# Patient Record
Sex: Male | Born: 1935 | Race: Black or African American | Hispanic: No | Marital: Married | Smoking: Never smoker
Health system: Southern US, Community
[De-identification: ages and names within clinical notes are randomized; demographics above are authoritative.]

## PROBLEM LIST (undated history)

## (undated) DIAGNOSIS — C61 Malignant neoplasm of prostate: Secondary | ICD-10-CM

## (undated) DIAGNOSIS — E119 Type 2 diabetes mellitus without complications: Secondary | ICD-10-CM

## (undated) DIAGNOSIS — E785 Hyperlipidemia, unspecified: Secondary | ICD-10-CM

## (undated) DIAGNOSIS — B1710 Acute hepatitis C without hepatic coma: Secondary | ICD-10-CM

## (undated) DIAGNOSIS — I4891 Unspecified atrial fibrillation: Secondary | ICD-10-CM

## (undated) DIAGNOSIS — I10 Essential (primary) hypertension: Secondary | ICD-10-CM

## (undated) DIAGNOSIS — I739 Peripheral vascular disease, unspecified: Secondary | ICD-10-CM

## (undated) HISTORY — DX: Type 2 diabetes mellitus without complications: E11.9

## (undated) HISTORY — DX: Acute hepatitis C without hepatic coma: B17.10

## (undated) HISTORY — DX: Hyperlipidemia, unspecified: E78.5

## (undated) HISTORY — DX: Peripheral vascular disease, unspecified: I73.9

---
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
## 2001-02-27 ENCOUNTER — Emergency Department (HOSPITAL_COMMUNITY): Admission: EM | Admit: 2001-02-27 | Discharge: 2001-02-27 | Payer: Self-pay | Admitting: Emergency Medicine

## 2001-02-27 ENCOUNTER — Encounter: Payer: Self-pay | Admitting: Emergency Medicine

## 2001-07-22 ENCOUNTER — Emergency Department (HOSPITAL_COMMUNITY): Admission: EM | Admit: 2001-07-22 | Discharge: 2001-07-22 | Payer: Self-pay

## 2002-02-28 ENCOUNTER — Encounter: Payer: Self-pay | Admitting: Family Medicine

## 2002-02-28 ENCOUNTER — Ambulatory Visit (HOSPITAL_COMMUNITY): Admission: RE | Admit: 2002-02-28 | Discharge: 2002-02-28 | Payer: Self-pay | Admitting: Family Medicine

## 2002-09-18 ENCOUNTER — Encounter (INDEPENDENT_AMBULATORY_CARE_PROVIDER_SITE_OTHER): Payer: Self-pay

## 2002-09-18 ENCOUNTER — Encounter: Payer: Self-pay | Admitting: Gastroenterology

## 2002-09-18 ENCOUNTER — Ambulatory Visit (HOSPITAL_COMMUNITY): Admission: RE | Admit: 2002-09-18 | Discharge: 2002-09-18 | Payer: Self-pay | Admitting: Gastroenterology

## 2003-08-20 ENCOUNTER — Ambulatory Visit (HOSPITAL_COMMUNITY): Admission: RE | Admit: 2003-08-20 | Discharge: 2003-08-20 | Payer: Self-pay | Admitting: Internal Medicine

## 2004-04-12 ENCOUNTER — Ambulatory Visit: Payer: Self-pay | Admitting: Internal Medicine

## 2004-06-28 ENCOUNTER — Ambulatory Visit: Payer: Self-pay | Admitting: Internal Medicine

## 2005-02-15 ENCOUNTER — Ambulatory Visit: Payer: Self-pay | Admitting: Internal Medicine

## 2005-02-18 ENCOUNTER — Ambulatory Visit: Payer: Self-pay | Admitting: Internal Medicine

## 2005-02-25 ENCOUNTER — Ambulatory Visit: Payer: Self-pay | Admitting: Internal Medicine

## 2005-03-04 ENCOUNTER — Ambulatory Visit: Payer: Self-pay | Admitting: Internal Medicine

## 2005-08-25 ENCOUNTER — Ambulatory Visit: Payer: Self-pay | Admitting: Internal Medicine

## 2006-09-27 ENCOUNTER — Ambulatory Visit: Payer: Self-pay | Admitting: Internal Medicine

## 2006-09-27 LAB — CONVERTED CEMR LAB
ALT: 17 units/L (ref 0–53)
AST: 26 units/L (ref 0–37)
Albumin: 4 g/dL (ref 3.5–5.2)
Alkaline Phosphatase: 78 units/L (ref 39–117)
BUN: 16 mg/dL (ref 6–23)
Basophils Absolute: 0 10*3/uL (ref 0.0–0.1)
Basophils Relative: 0.1 % (ref 0.0–1.0)
Bilirubin, Direct: 0.1 mg/dL (ref 0.0–0.3)
CO2: 32 meq/L (ref 19–32)
Calcium: 9.4 mg/dL (ref 8.4–10.5)
Chloride: 104 meq/L (ref 96–112)
Cholesterol: 195 mg/dL (ref 0–200)
Creatinine, Ser: 1.1 mg/dL (ref 0.4–1.5)
Eosinophils Absolute: 0.1 10*3/uL (ref 0.0–0.6)
Eosinophils Relative: 1.4 % (ref 0.0–5.0)
GFR calc Af Amer: 85 mL/min
GFR calc non Af Amer: 70 mL/min
Glucose, Bld: 88 mg/dL (ref 70–99)
HCT: 44.8 % (ref 39.0–52.0)
HDL: 29.9 mg/dL — ABNORMAL LOW (ref >39.0)
Hemoglobin: 15.4 g/dL (ref 13.0–17.0)
LDL Cholesterol: 133 mg/dL — ABNORMAL HIGH (ref 0–99)
Lymphocytes Relative: 45.3 % (ref 12.0–46.0)
MCHC: 34.3 g/dL (ref 30.0–36.0)
MCV: 92.5 fL (ref 78.0–100.0)
Monocytes Absolute: 0.7 10*3/uL (ref 0.2–0.7)
Monocytes Relative: 11.1 % — ABNORMAL HIGH (ref 3.0–11.0)
Neutro Abs: 2.5 10*3/uL (ref 1.4–7.7)
Neutrophils Relative %: 42.1 % — ABNORMAL LOW (ref 43.0–77.0)
PSA: 5.46 ng/mL — ABNORMAL HIGH (ref 0.10–4.00)
Platelets: 209 10*3/uL (ref 150–400)
Potassium: 4.4 meq/L (ref 3.5–5.1)
RBC: 4.85 M/uL (ref 4.22–5.81)
RDW: 12 % (ref 11.5–14.6)
Sodium: 141 meq/L (ref 135–145)
TSH: 1.75 microintl units/mL (ref 0.35–5.50)
Total Bilirubin: 1.2 mg/dL (ref 0.3–1.2)
Total CHOL/HDL Ratio: 6.5
Total Protein: 7.7 g/dL (ref 6.0–8.3)
Triglycerides: 161 mg/dL — ABNORMAL HIGH (ref 0–149)
VLDL: 32 mg/dL (ref 0–40)
WBC: 6 10*3/uL (ref 4.5–10.5)

## 2006-10-27 ENCOUNTER — Ambulatory Visit: Payer: Self-pay | Admitting: Internal Medicine

## 2006-11-07 ENCOUNTER — Encounter: Payer: Self-pay | Admitting: Internal Medicine

## 2006-11-07 ENCOUNTER — Ambulatory Visit: Payer: Self-pay | Admitting: Internal Medicine

## 2007-01-02 ENCOUNTER — Encounter: Payer: Self-pay | Admitting: Internal Medicine

## 2007-01-02 DIAGNOSIS — B1710 Acute hepatitis C without hepatic coma: Secondary | ICD-10-CM | POA: Insufficient documentation

## 2007-01-02 DIAGNOSIS — M109 Gout, unspecified: Secondary | ICD-10-CM

## 2007-01-02 DIAGNOSIS — B351 Tinea unguium: Secondary | ICD-10-CM

## 2007-01-02 DIAGNOSIS — I10 Essential (primary) hypertension: Secondary | ICD-10-CM | POA: Insufficient documentation

## 2007-02-22 HISTORY — PX: EYE SURGERY: SHX253

## 2007-08-01 ENCOUNTER — Encounter: Payer: Self-pay | Admitting: Internal Medicine

## 2007-08-02 ENCOUNTER — Emergency Department (HOSPITAL_COMMUNITY): Admission: EM | Admit: 2007-08-02 | Discharge: 2007-08-03 | Payer: Self-pay | Admitting: Emergency Medicine

## 2007-08-02 ENCOUNTER — Telehealth: Payer: Self-pay | Admitting: Internal Medicine

## 2007-08-08 ENCOUNTER — Ambulatory Visit: Payer: Self-pay | Admitting: Internal Medicine

## 2007-08-08 DIAGNOSIS — I4949 Other premature depolarization: Secondary | ICD-10-CM

## 2007-08-08 DIAGNOSIS — I739 Peripheral vascular disease, unspecified: Secondary | ICD-10-CM

## 2007-08-22 ENCOUNTER — Encounter: Payer: Self-pay | Admitting: Internal Medicine

## 2007-08-22 ENCOUNTER — Ambulatory Visit: Payer: Self-pay

## 2007-09-01 ENCOUNTER — Encounter: Payer: Self-pay | Admitting: Internal Medicine

## 2008-03-06 ENCOUNTER — Ambulatory Visit: Payer: Self-pay | Admitting: Internal Medicine

## 2008-05-02 ENCOUNTER — Ambulatory Visit: Payer: Self-pay | Admitting: Internal Medicine

## 2008-05-02 ENCOUNTER — Telehealth (INDEPENDENT_AMBULATORY_CARE_PROVIDER_SITE_OTHER): Payer: Self-pay | Admitting: *Deleted

## 2008-05-02 DIAGNOSIS — R7309 Other abnormal glucose: Secondary | ICD-10-CM

## 2008-05-02 LAB — CONVERTED CEMR LAB
ALT: 17 units/L (ref 0–53)
AST: 25 units/L (ref 0–37)
Albumin: 3.9 g/dL (ref 3.5–5.2)
Alkaline Phosphatase: 101 units/L (ref 39–117)
BUN: 15 mg/dL (ref 6–23)
Basophils Absolute: 0 10*3/uL (ref 0.0–0.1)
Basophils Relative: 0.8 % (ref 0.0–3.0)
Bilirubin, Direct: 0.1 mg/dL (ref 0.0–0.3)
CO2: 33 meq/L — ABNORMAL HIGH (ref 19–32)
Calcium: 9.2 mg/dL (ref 8.4–10.5)
Chloride: 98 meq/L (ref 96–112)
Creatinine, Ser: 1.2 mg/dL (ref 0.4–1.5)
Crystals: NEGATIVE
Eosinophils Absolute: 0.1 10*3/uL (ref 0.0–0.7)
Eosinophils Relative: 1.5 % (ref 0.0–5.0)
GFR calc Af Amer: 77 mL/min
GFR calc non Af Amer: 63 mL/min
Glucose, Bld: 307 mg/dL — ABNORMAL HIGH (ref 70–99)
HCT: 45.9 % (ref 39.0–52.0)
Hemoglobin, Urine: NEGATIVE
Hemoglobin: 16 g/dL (ref 13.0–17.0)
Leukocytes, UA: NEGATIVE
Lymphocytes Relative: 48 % — ABNORMAL HIGH (ref 12.0–46.0)
MCHC: 34.8 g/dL (ref 30.0–36.0)
MCV: 93.1 fL (ref 78.0–100.0)
Monocytes Absolute: 0.5 10*3/uL (ref 0.1–1.0)
Monocytes Relative: 9.3 % (ref 3.0–12.0)
Neutro Abs: 2.1 10*3/uL (ref 1.4–7.7)
Neutrophils Relative %: 40.4 % — ABNORMAL LOW (ref 43.0–77.0)
Nitrite: NEGATIVE
Platelets: 195 10*3/uL (ref 150–400)
Potassium: 3.7 meq/L (ref 3.5–5.1)
RBC: 4.94 M/uL (ref 4.22–5.81)
RDW: 11.9 % (ref 11.5–14.6)
Sodium: 137 meq/L (ref 135–145)
Specific Gravity, Urine: >=1.03 (ref 1.000–1.035)
Total Bilirubin: 1 mg/dL (ref 0.3–1.2)
Total Protein, Urine: 100 mg/dL — AB
Total Protein: 7.8 g/dL (ref 6.0–8.3)
Uric Acid, Serum: 5.7 mg/dL (ref 4.0–7.8)
Urine Glucose: 250 mg/dL — AB
Urobilinogen, UA: 1 (ref 0.0–1.0)
WBC: 5.1 10*3/uL (ref 4.5–10.5)
pH: 6 (ref 5.0–8.0)

## 2008-05-06 ENCOUNTER — Ambulatory Visit: Payer: Self-pay | Admitting: Internal Medicine

## 2008-05-06 DIAGNOSIS — E119 Type 2 diabetes mellitus without complications: Secondary | ICD-10-CM

## 2008-05-07 ENCOUNTER — Ambulatory Visit: Payer: Self-pay | Admitting: Internal Medicine

## 2008-05-07 LAB — CONVERTED CEMR LAB: Hgb A1c MFr Bld: 7.2 % — ABNORMAL HIGH (ref 4.6–6.5)

## 2008-08-07 ENCOUNTER — Ambulatory Visit: Payer: Self-pay | Admitting: Internal Medicine

## 2008-08-07 LAB — CONVERTED CEMR LAB: Hgb A1c MFr Bld: 5.6 % (ref 4.6–6.5)

## 2008-08-10 ENCOUNTER — Telehealth: Payer: Self-pay | Admitting: Internal Medicine

## 2009-10-02 ENCOUNTER — Ambulatory Visit: Payer: Self-pay | Admitting: Internal Medicine

## 2009-10-02 DIAGNOSIS — N529 Male erectile dysfunction, unspecified: Secondary | ICD-10-CM | POA: Insufficient documentation

## 2009-10-02 LAB — CONVERTED CEMR LAB
Albumin: 4.3 g/dL (ref 3.5–5.2)
BUN: 16 mg/dL (ref 6–23)
Chloride: 102 meq/L (ref 96–112)
Creatinine, Ser: 1 mg/dL (ref 0.4–1.5)
GFR calc non Af Amer: 98.55 mL/min (ref >60)
Potassium: 3.9 meq/L (ref 3.5–5.1)
Testosterone: 227.56 ng/dL — ABNORMAL LOW (ref 350.00–890.00)
Total Bilirubin: 1 mg/dL (ref 0.3–1.2)
Uric Acid, Serum: 7.5 mg/dL (ref 4.0–7.8)

## 2009-10-09 ENCOUNTER — Ambulatory Visit: Payer: Self-pay | Admitting: Internal Medicine

## 2010-01-18 ENCOUNTER — Telehealth: Payer: Self-pay | Admitting: Internal Medicine

## 2010-03-23 NOTE — Assessment & Plan Note (Signed)
Summary: PER MD SUCTURE REMOVAL --STC   Vital Signs:  Patient profile:   77 year old male Height:      67 inches Weight:      189 pounds BMI:     29.71 O2 Sat:      98 % on Room air Temp:     97.6 degrees F oral Pulse rate:   60 / minute BP sitting:   112 / 78  (left arm) Cuff size:   regular  Vitals Entered By: Bill Salinas CMA (October 09, 2009 11:21 AM)  O2 Flow:  Room air CC: pt here for suture removal/ ab Comments Pt states he is not taking tylenol, oxybutynin or indomethacin   Primary Care Provider:  Norins  CC:  pt here for suture removal/ ab.  History of Present Illness: patient for suture removal after excision of lipoma right proximal UE. No complications reported  Current Medications (verified): 1)  Amlodipine Besylate 10 Mg  Tabs (Amlodipine Besylate) .Marland Kitchen.. 1 By Mouth Once Daily For Blood Pressure 2)  Tylenol Extra Strength 500 Mg  Tabs (Acetaminophen) .... As Needed 3)  Allopurinol 300 Mg Tabs (Allopurinol) .... Take One Tab By Mouth After Meals For Gout. (See Comments) 4)  Oxybutynin Chloride 5 Mg Tabs (Oxybutynin Chloride) .... Take 1 Tablet By Mouth Two Times A Day 5)  Indomethacin 25 Mg Caps (Indomethacin) .Marland Kitchen.. 1 By Mouth Three Times A Day As Needed Acute Gout Flare. 6)  Colcrys 0.6 Mg Tabs (Colchicine) .... One By Mouth Bid  Allergies (verified): 1)  ! * Hair Dye PMH-FH-SH reviewed-no changes except otherwise noted  Physical Exam  General:  Well-developed,well-nourished,in no acute distress; alert,appropriate and cooperative throughout examination Skin:  wound on proximal RUE with good healing, no sign of inflammation. Sutures removed without problem.   Impression & Recommendations:  Problem # 1:  LIPOMA, SKIN (ICD-214.1) for wound check and suture removal: no complications. There will be a residual nodule at site.   Complete Medication List: 1)  Amlodipine Besylate 10 Mg Tabs (Amlodipine besylate) .Marland Kitchen.. 1 by mouth once daily for blood pressure 2)   Tylenol Extra Strength 500 Mg Tabs (Acetaminophen) .... As needed 3)  Allopurinol 300 Mg Tabs (Allopurinol) .... Take one tab by mouth after meals for gout. (see comments) 4)  Oxybutynin Chloride 5 Mg Tabs (Oxybutynin chloride) .... Take 1 tablet by mouth two times a day 5)  Indomethacin 25 Mg Caps (Indomethacin) .Marland Kitchen.. 1 by mouth three times a day as needed acute gout flare. 6)  Colcrys 0.6 Mg Tabs (Colchicine) .... One by mouth bid

## 2010-03-23 NOTE — Miscellaneous (Signed)
Summary: [facility]   Imported By: Sherian Rein 10/06/2009 11:08:06  _____________________________________________________________________  External Attachment:    Type:   Image     Comment:   External Document

## 2010-03-23 NOTE — Progress Notes (Signed)
  Phone Note Refill Request Message from:  Patient on January 18, 2010 3:50 PM  Refills Requested: Medication #1:  AMLODIPINE BESYLATE 10 MG  TABS 1 by mouth once daily for blood pressure Initial call taken by: Ami Bullins CMA,  January 18, 2010 3:50 PM    Prescriptions: AMLODIPINE BESYLATE 10 MG  TABS (AMLODIPINE BESYLATE) 1 by mouth once daily for blood pressure  #60 x 2   Entered by:   Ami Bullins CMA   Authorized by:   Jacques Navy MD   Signed by:   Bill Salinas CMA on 01/18/2010   Method used:   Electronically to        [id number] (retail)       [address] [place], [place]  [address]       Ph: [phone]       Fax: [phone]   RxID:   [id number]

## 2010-03-23 NOTE — Assessment & Plan Note (Signed)
Summary: KNOT UNDER ARM FOR A YR/ REFER TO SURGERY? /NWS  #   Vital Signs:  Patient profile:   77 year old male Height:      67 inches (170.18 cm) Weight:      186 pounds (84.55 kg) BMI:     29.24 O2 Sat:      98 % on Room air Temp:     97.1 degrees F (36.17 degrees C) oral Pulse rate:   73 / minute BP sitting:   150 / 90  (left arm) Cuff size:   regular  Vitals Entered By: Lucious Groves CMA (October 02, 2009 10:37 AM)  O2 Flow:  Room air CC: C/O knot on arm for over a year that he would like removed./kb Is Patient Diabetic? Yes Did you bring your meter with you today? No Pain Assessment Patient in pain? no      Comments Patient notes that the knot is not painful, and the only med he is taking is Amlodipine./kb   Primary Care Provider:  Norins  CC:  C/O knot on arm for over a year that he would like removed./kb.  History of Present Illness: Patient presents with a small soft tumor on the right UE at bse of deltoid. He finds this uncomfortable and is concerned about the nature of the lesion. He is requesting excision.   He c/o decreased erectile function.  Current Medications (verified): 1)  Amlodipine Besylate 10 Mg  Tabs (Amlodipine Besylate) .Marland Kitchen.. 1 By Mouth Once Daily For Blood Pressure 2)  Tylenol Extra Strength 500 Mg  Tabs (Acetaminophen) .... As Needed 3)  Allopurinol 300 Mg Tabs (Allopurinol) .... Take One Tab By Mouth After Meals For Gout. (See Comments) 4)  Oxybutynin Chloride 5 Mg Tabs (Oxybutynin Chloride) .... Take 1 Tablet By Mouth Two Times A Day 5)  Indomethacin 25 Mg Caps (Indomethacin) .Marland Kitchen.. 1 By Mouth Three Times A Day As Needed Acute Gout Flare. 6)  Colcrys 0.6 Mg Tabs (Colchicine) .... One By Mouth Bid  Allergies (verified): 1)  ! * Hair Dye  Past History:  Past Medical History: Last updated: 08/08/2007  ONYCHOMYCOSIS (ICD-110.1) GOUT (ICD-274.9) HYPERTENSION (ICD-401.9) HEPATITIS C (ICD-070.51)  Past Surgical History: Last updated:  08/08/2007 cataracts '09 PSH reviewed for relevance, FH reviewed for relevance  Review of Systems  The patient denies anorexia, fever, weight loss, dyspnea on exertion, peripheral edema, abdominal pain, severe indigestion/heartburn, and difficulty walking.    Physical Exam  General:  WNWD african gentleman in no distress Skin:  1.5 cm soft tissue lump[ proximal right UE.   Impression & Recommendations:  Problem # 1:  LIPOMA, SKIN (ICD-214.1)  Patient with suspicious soft tissue lesion right proximal UE  Procedure - surgical excision of tumor 1.5 cm diameter Consent- signed informed consent Anesthesia - 2% xylocain with epinephrine Prep - betadiene Procedure - incised the lesion with # 15 scalpel, explored with curved hemostate and single tooth forceps. Small fatty tumor excised carefully with exposure to the fascia of the triceps. No bleeding encountered. Closed the wound with 3 interrupted sutures using 3-0 ethilon. Bandaide applied. Patient tolerated well. Routine wound care instructions provided. Return in 8 days for suture removal  Orders: Excise lesion (TAL) 1.1 - 2.0 cm (11402)  Complete Medication List: 1)  Amlodipine Besylate 10 Mg Tabs (Amlodipine besylate) .Marland Kitchen.. 1 by mouth once daily for blood pressure 2)  Tylenol Extra Strength 500 Mg Tabs (Acetaminophen) .... As needed 3)  Allopurinol 300 Mg Tabs (Allopurinol) .... Take one  tab by mouth after meals for gout. (see comments) 4)  Oxybutynin Chloride 5 Mg Tabs (Oxybutynin chloride) .... Take 1 tablet by mouth two times a day 5)  Indomethacin 25 Mg Caps (Indomethacin) .Marland Kitchen.. 1 by mouth three times a day as needed acute gout flare. 6)  Colcrys 0.6 Mg Tabs (Colchicine) .... One by mouth bid  Other Orders: TLB-Testosterone, Total (84403-TESTO) TLB-BMP (Basic Metabolic Panel-BMET) (80048-METABOL) TLB-Uric Acid, Blood (84550-URIC) TLB-Hepatic/Liver Function Pnl (80076-HEPATIC)

## 2010-07-06 NOTE — Assessment & Plan Note (Signed)
[facility]                           PRIMARY CARE OFFICE NOTE   Dakota, Scott                    MRN:          [id number]  DATE:09/27/2006                            DOB:          Aug 25, 1933    ADDENDUM:  Laboratory ordered and pending includes a lipid panel, PSA,  comprehensive metabolic panel.   ASSESSMENT AND PLAN:  1. Hypertension.  The patient is adequately controlled on his present      medications, he will continue the same.  2. Gout.  No flares of gout are noted.  The patient will continue on      allopurinol.  3. Irritable bladder.  Patient is tolerating his oxybutynin without      difficulty or significant dryness.  4. Health maintenance.  Patient with a normal EKG at today's visit.  5. Laboratories were ordered and pending and patient will be notified      via phone tree.  6. Patient is scheduled for colonoscopy for September 16th at 10 a.m.      with Dr. Leone Payor.  He is also scheduled for preop on October 27, 2006, and the patient is aware of these appointments.  7. The patient will be notified by phone tree as to his laboratory      results when available.     Rosalyn Gess Norins, MD  Electronically Signed    MEN/MedQ  DD: 09/27/2006  DT: 09/28/2006  Job #: [id number]   cc:   Alfonzo Feller

## 2010-07-06 NOTE — Assessment & Plan Note (Signed)
[facility]                           PRIMARY CARE OFFICE NOTE   Dakota Scott, Dakota Scott                    MRN:          [id number]  DATE:09/27/2006                            DOB:          June 19, 1933    Dakota Scott is a 77 year old gentleman from [place], who  presents for a followup evaluation and exam.  He was last seen in the  office August 25, 2005, in followup for excision of a skin nodule in his  right shoulder.  He also was checked for hypertension at that time.   INTERVAL HISTORY:  The patient reports he has been feeling well and  doing well, with only minor complaints of occasional shoulder discomfort  and some mild back pain.   PAST MEDICAL HISTORY:  Surgical:  None.   Medical:  1. Usual childhood disease.  2. Hospitalized for fevers, 1953.  3. Blood in his stool as a youth.  4. Arthritis.  5. Hypertension.  6. History of Hepatitis C treated with Pegasys and ribavirin,      completing therapy May 15, 2003, with no detectible virus.  7. Irritable bladder.   CURRENT MEDICATIONS:  1. Norvasc 10 mg daily.  2. Allopurinol 300 mg daily.  3. Oxybutynin 5 mg b.i.d.   ALLERGIES:  None.  THE PATIENT IS ALLERGIC TO HAIR DYE.   FAMILY HISTORY:  Negative for colon cancer, lung cancer, hyperlipidemia,  heart disease or diabetes.   SOCIAL HISTORY:  The patient is a native of [place].  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  Patient moved to the [place] in 1998  with his wife, son and daughter.  He has 4 other daughters, one in  [place], two in [place], one in [place].  The patient works for  [employer].   REVIEW OF SYSTEMS:  Patient has had no fevers, sweats, chills.  His  weight has been stable.  Patient reports he does have some blurred  vision and difficulty with vision and bright light.  No ENT complaints,  no cardiovascular,  respiratory, GI or GU complaints.  Musculoskeletal  complaints include some shoulder pain and low back pain.   EXAMINATION:  Temperature 97.8, blood pressure 132/81, pulse was 71,  weight 194.  GENERAL APPEARANCE:  A well-nourished, well-developed African gentleman  looking younger than his stated chronologic age of 42.  He is in no  acute distress.  HEENT EXAM:  Normocephalic, atraumatic.  EACs and TMs reveal cerumen on  the right, left TM was visualized and normal.  Oropharynx with native  dentition with no obvious caries, no gingivitis, no lesions on the  buccal membranes.  Posterior pharynx was clear.  Conjunctiva and sclera  was clear.  Pupils equal, round, reactive to light and accommodation.  Funduscopic exam on the right hindered by presence of a cataract.  Left  was difficult to bring to focus but no obvious abnormalities were noted.  NECK:  Supple without thyromegaly.  No adenopathy was noted in the  cervical, supraclavicular,  axillary or inguinal regions.  CHEST:  No deformities are noted.  LUNGS:  Clear with no rales, wheezes or rhonchi.  CARDIOVASCULAR:  Radial pulse 2+ and 2+ dorsalis pedis pulse.  He had no  JVD, no carotid bruits.  He had a quiet precordium.  He had a regular  rate and rhythm without murmurs, rubs or gallops.  He was noted to have  a bruit at the right femoral artery.  An easily palpable pulse is noted.  ABDOMEN:  Soft, no guarding or rebound, there is no organosplenomegaly  noted.  RECTAL EXAM:  Normal sphincter tone was noted.  The prostate was smooth,  round and normal in size and contour without nodules.  No tenderness was  noted.  EXTREMITIES:  Without deformity.  He has full range of motion of his  right shoulder but he does have some mild tenderness.  SKIN:  Patient has a small scar at his right shoulder.  No other obvious  skin lesions were noted.   DATABASE:  A 12-lead electrocardiogram revealed a normal sinus rhythm  with a premature  ventricular complex or effusion complex noted.     Rosalyn Gess Norins, MD  Electronically Signed    MEN/MedQ  DD: 09/27/2006  DT: 09/28/2006  Job #: [id number]

## 2010-07-27 ENCOUNTER — Other Ambulatory Visit: Payer: Self-pay | Admitting: Internal Medicine

## 2010-08-12 ENCOUNTER — Other Ambulatory Visit: Payer: Self-pay | Admitting: *Deleted

## 2010-08-12 MED ORDER — COLCHICINE 0.6 MG PO TABS: 0.6000 mg | ORAL_TABLET | Freq: Every day | ORAL | Status: DC

## 2010-08-12 MED ORDER — ALLOPURINOL 300 MG PO TABS: 300.0000 mg | ORAL_TABLET | Freq: Every day | ORAL | Status: DC

## 2010-08-23 ENCOUNTER — Other Ambulatory Visit: Payer: Self-pay | Admitting: Internal Medicine

## 2010-11-18 LAB — COMPREHENSIVE METABOLIC PANEL
Albumin: 3.8
Alkaline Phosphatase: 90
BUN: 13
Chloride: 101
Glucose, Bld: 141 — ABNORMAL HIGH
Potassium: 3.1 — ABNORMAL LOW
Total Bilirubin: 1.3 — ABNORMAL HIGH

## 2010-11-18 LAB — URINALYSIS, ROUTINE W REFLEX MICROSCOPIC
Bilirubin Urine: NEGATIVE
Glucose, UA: NEGATIVE
Hgb urine dipstick: NEGATIVE
Specific Gravity, Urine: 1.024
Urobilinogen, UA: 1
pH: 6.5

## 2010-11-18 LAB — CBC
HCT: 44.5
Hemoglobin: 15.4
Platelets: 199
WBC: 15.1 — ABNORMAL HIGH

## 2010-11-18 LAB — DIFFERENTIAL
Basophils Absolute: 0
Basophils Relative: 0
Eosinophils Absolute: 0
Monocytes Absolute: 0.9
Neutro Abs: 12.8 — ABNORMAL HIGH

## 2010-11-18 LAB — URINE CULTURE
Colony Count: NO GROWTH
Culture: NO GROWTH

## 2010-11-18 LAB — POCT CARDIAC MARKERS: CKMB, poc: 1.6

## 2010-11-22 ENCOUNTER — Other Ambulatory Visit: Payer: Self-pay | Admitting: Internal Medicine

## 2011-01-22 ENCOUNTER — Other Ambulatory Visit: Payer: Self-pay | Admitting: Internal Medicine

## 2011-01-29 ENCOUNTER — Encounter: Payer: Self-pay | Admitting: Emergency Medicine

## 2011-01-29 ENCOUNTER — Emergency Department (HOSPITAL_COMMUNITY)
Admission: EM | Admit: 2011-01-29 | Discharge: 2011-01-29 | Disposition: A | Discharge status: Home / Self Care | Payer: Medicare Other | Attending: Emergency Medicine | Admitting: Emergency Medicine

## 2011-01-29 DIAGNOSIS — I10 Essential (primary) hypertension: Secondary | ICD-10-CM | POA: Insufficient documentation

## 2011-01-29 DIAGNOSIS — Z7982 Long term (current) use of aspirin: Secondary | ICD-10-CM | POA: Insufficient documentation

## 2011-01-29 DIAGNOSIS — Z79899 Other long term (current) drug therapy: Secondary | ICD-10-CM | POA: Insufficient documentation

## 2011-01-29 DIAGNOSIS — M25569 Pain in unspecified knee: Secondary | ICD-10-CM | POA: Insufficient documentation

## 2011-01-29 DIAGNOSIS — M25469 Effusion, unspecified knee: Secondary | ICD-10-CM | POA: Insufficient documentation

## 2011-01-29 DIAGNOSIS — M109 Gout, unspecified: Secondary | ICD-10-CM | POA: Insufficient documentation

## 2011-01-29 HISTORY — DX: Essential (primary) hypertension: I10

## 2011-01-29 LAB — POCT I-STAT, CHEM 8
Glucose, Bld: 176 mg/dL — ABNORMAL HIGH (ref 70–99)
Sodium: 138 mEq/L (ref 135–145)

## 2011-01-29 MED ORDER — COLCHICINE 0.6 MG PO TABS: 0.6000 mg | ORAL_TABLET | Freq: Every day | ORAL | Status: DC

## 2011-01-29 MED ORDER — ALLOPURINOL 100 MG PO TABS: 100.0000 mg | ORAL_TABLET | Freq: Every day | ORAL | Status: DC

## 2011-01-29 MED ORDER — LIDOCAINE-EPINEPHRINE 2 %-1:100000 IJ SOLN
20.0000 mL | Freq: Once | INTRAMUSCULAR | Status: DC
  Filled 2011-01-29: qty 20

## 2011-01-29 MED ORDER — OXYCODONE-ACETAMINOPHEN 5-325 MG PO TABS
1.0000 | ORAL_TABLET | Freq: Once | ORAL | Status: AC
  Administered 2011-01-29: 1 via ORAL
  Filled 2011-01-29: qty 1

## 2011-01-29 MED ORDER — LIDOCAINE-EPINEPHRINE 1 %-1:100000 IJ SOLN
INTRAMUSCULAR | Status: AC
  Administered 2011-01-29: 19:00:00
  Filled 2011-01-29: qty 1

## 2011-01-29 MED ORDER — OXYCODONE-ACETAMINOPHEN 5-325 MG PO TABS: 1.0000 | ORAL_TABLET | Freq: Four times a day (QID) | ORAL | Status: AC | PRN

## 2011-01-29 NOTE — ED Notes (Signed)
Pt here with c/o right knee pain and right hand pain both with mild swelling. States hx of gout and feels the same. Denies taking meds for same.

## 2011-01-29 NOTE — ED Provider Notes (Addendum)
History     CSN: [id number] Arrival date & time: 01/29/2011  5:13 PM   First MD Initiated Contact with Patient 01/29/11 1739      Chief Complaint  Patient presents with  . Knee Pain    right knee pain and swellind and right hand swelling and pain, states hx of gout.    (Consider location/radiation/quality/duration/timing/severity/associated sxs/prior treatment) Patient is a 77 y.o. male presenting with knee pain. The history is provided by the patient and a relative.  Knee Pain The current episode started more than 2 days ago. The problem occurs constantly. The problem has been gradually worsening. Pertinent negatives include no shortness of breath. Associated symptoms comments: No fever and no rash. The symptoms are aggravated by walking, standing and bending. The symptoms are relieved by rest (Elevation). He has tried nothing (He used to be on colchicine and allopurinol for his gout however he finished that several months ago it is currently taking nothing) for the symptoms. The treatment provided no relief.    Past Medical History  Diagnosis Date  . Gout   . Hypertension     History reviewed. No pertinent past surgical history.  History reviewed. No pertinent family history.  History  Substance Use Topics  . Smoking status: Never Smoker   . Smokeless tobacco: Not on file  . Alcohol Use: No      Review of Systems  Constitutional: Negative for fever and chills.  Respiratory: Negative for shortness of breath.   Cardiovascular: Negative for leg swelling.  Musculoskeletal: Positive for joint swelling.  All other systems reviewed and are negative.    Allergies  Review of patient's allergies indicates no known allergies.  Home Medications   Current Outpatient Rx  Name Route Sig Dispense Refill  . AMLODIPINE BESYLATE 10 MG PO TABS Oral Take 10 mg by mouth daily.      . ASPIRIN EC 81 MG PO TBEC Oral Take 81 mg by mouth daily.        BP 148/81  Pulse 80   Temp(Src) 97.8 F (36.6 C) (Oral)  Resp 16  Physical Exam  Nursing note and vitals reviewed. Constitutional: He is oriented to person, place, and time. He appears well-developed and well-nourished. No distress.  HENT:  Head: Normocephalic and atraumatic.  Mouth/Throat: Oropharynx is clear and moist.  Eyes: Conjunctivae and EOM are normal. Pupils are equal, round, and reactive to light.  Neck: Normal range of motion. Neck supple.  Cardiovascular: Normal rate, regular rhythm and intact distal pulses.   No murmur heard. Pulmonary/Chest: Effort normal and breath sounds normal. No respiratory distress. He has no wheezes. He has no rales.  Musculoskeletal: He exhibits tenderness. He exhibits no edema.       Right wrist: He exhibits tenderness and swelling. He exhibits normal range of motion.       Right knee: He exhibits decreased range of motion, swelling, effusion and erythema.       Arms: Neurological: He is alert and oriented to person, place, and time.  Skin: Skin is warm and dry. No rash noted. No erythema.  Psychiatric: He has a normal mood and affect. His behavior is normal.    ED Course  ARTHOCENTESIS Date/Time: 01/29/2011 6:50 PM Performed by: Gwyneth Sprout Authorized by: Gwyneth Sprout Consent: Verbal consent obtained. Written consent not obtained. Risks and benefits: risks, benefits and alternatives were discussed Consent given by: patient Patient understanding: patient states understanding of the procedure being performed Relevant documents: relevant documents present and verified  Patient identity confirmed: verbally with patient Time out: Immediately prior to procedure a "time out" was called to verify the correct patient, procedure, equipment, support staff and site/side marked as required. Indications: joint swelling, pain, possible septic joint and diagnostic evaluation  Body area: knee Joint: right knee Local anesthesia used: yes Anesthesia: local  infiltration Local anesthetic: lidocaine 2% with epinephrine Anesthetic total: 8 ml Patient sedated: no Preparation: Patient was prepped and draped in the usual sterile fashion. Needle gauge: 18 G Approach: medial Aspirate: bloody (crystals present) Aspirate amount: 0.3 ml Patient tolerance: Patient tolerated the procedure well with no immediate complications.   (including critical care time)  Labs Reviewed  POCT I-STAT, CHEM 8 - Abnormal; Notable for the following:    Glucose, Bld 176 (*)    All other components within normal limits  LAB REPORT - SCANNED  GRAM STAIN  BODY FLUID CULTURE   No results found.   No diagnosis found.    MDM   Patient with history of gout in his right ankle who 2 weeks ago developed swelling, pain, redness in his right wrist which is improving. However 3 days ago developed pain and swelling in his right knee which he assumes is gout but now to the point where he is unable to walk on it due to severe pain. On exam difficult for patient to bend his knee due to severe pain with mild swelling and warmth without erythema. Most likely gout however will do knee aspiration to rule out infection. On aspiration only 1-2 drops of fluid was obtained however in the struts of fluid there was crystals. Patient denies any fever or for systemic symptoms. After injection of lidocaine patient's knee pain is completely resolved. He denies any trauma and no external signs of trauma or deformity. Will check i-STAT to ensure normal creatinine before treating gout.   I-STAT within normal limits. Gout crystals present and fluid.7:46 PM W in all ill treat with colchicine and allopurinol.      Gwyneth Sprout, MD 01/29/11 [id number]  Gwyneth Sprout, MD 01/30/11 [phone]

## 2011-01-29 NOTE — ED Notes (Signed)
Pt ambulated with a steady gait; VSS; no acute signs of distress; respirations even and unlabored; no questions at this time; leaving with family.

## 2011-01-31 ENCOUNTER — Ambulatory Visit (INDEPENDENT_AMBULATORY_CARE_PROVIDER_SITE_OTHER): Payer: Medicare Other | Admitting: Internal Medicine

## 2011-01-31 DIAGNOSIS — B1710 Acute hepatitis C without hepatic coma: Secondary | ICD-10-CM

## 2011-01-31 DIAGNOSIS — M109 Gout, unspecified: Secondary | ICD-10-CM

## 2011-01-31 DIAGNOSIS — IMO0001 Reserved for inherently not codable concepts without codable children: Secondary | ICD-10-CM

## 2011-01-31 DIAGNOSIS — I10 Essential (primary) hypertension: Secondary | ICD-10-CM

## 2011-01-31 MED ORDER — COLCHICINE 0.6 MG PO TABS: ORAL_TABLET | ORAL | Status: AC

## 2011-01-31 MED ORDER — COLCHICINE 0.6 MG PO TABS: ORAL_TABLET | ORAL | Status: DC

## 2011-01-31 MED ORDER — ALLOPURINOL 100 MG PO TABS: 100.0000 mg | ORAL_TABLET | Freq: Every day | ORAL | Status: DC

## 2011-01-31 MED ORDER — AMLODIPINE BESYLATE 10 MG PO TABS: 10.0000 mg | ORAL_TABLET | Freq: Every day | ORAL | Status: DC

## 2011-01-31 NOTE — Patient Instructions (Signed)
Gout - a problem you are familiar with. Colchicine (colcrys) is reserved for treatment of the acute flare of gout. It is take at 1.2 mg (2x0.6 mg) and if the pain is still present at 1 hr you may take another 0.6 mg tablet = limit of 1.8 mg in 24 hrs. This may be repeated for one or two days. The other medications for the acute flare of gout, in addition to the colchicine, is an anti-inflammatory drug, e.g. Ibuprofen 800 mg 3 times a day or aleve 440 mg (2 x 220mg ) twice a day.   For gout prevention please continue the allopurinol. I am sorry that we let you run out! PLan - once you have been taking the allopurinol daily for several weeks we will need to recheck the uric acid level to ascertain if we need to increase the dose of allopurinol.

## 2011-02-01 NOTE — Progress Notes (Signed)
  Subjective:    Patient ID: Dakota Scott, male    DOB: Jul 23, 1933, 77 y.o.   MRN: [id number]  HPI Recently seen in the ED for a flare of gout affecting the right knee. Hospital record reviewed: pt had aspiration of fluid right knee positive for crystals. He was started on colcrys, percocet and to continue allopurinol. Reveiwed chart - last Uric acid level was in normal range. He is still having a lot of knee pain and has difficulty bearing weight.  I have reviewed the patient's medical history in detail and updated the computerized patient record.    Review of Systems System review is negative for any constitutional, cardiac, pulmonary, GI or neuro symptoms or complaints other than as described in the HPI.     Objective:   Physical Exam Vitals reviewed Gen'l- WNWD older african man in no distress Pulm - normal respirations Cor - RRR Ext - right knee w/o redness, heat, effusion. Mild to moderately tender to palpation. Painful with flexion.       Assessment & Plan:

## 2011-02-01 NOTE — Assessment & Plan Note (Signed)
Patient with recent flare of gout. Reviewed hospital records. Discussed treatment and prevention with the patient and his daughter, a Engineer, civil (consulting). He should limit use of colchicine to acute flare dosing 1.2 mg intially with 0.6 mg repeat at 1 hr if needed. He needs to treat pain with NSAID, not oxycodone. He will need to continue allopurinol. Will recheck uric acid level and adjust allopurinol dose as indicated.

## 2011-02-10 ENCOUNTER — Other Ambulatory Visit (INDEPENDENT_AMBULATORY_CARE_PROVIDER_SITE_OTHER): Payer: Medicare Other

## 2011-02-10 DIAGNOSIS — I10 Essential (primary) hypertension: Secondary | ICD-10-CM

## 2011-02-10 DIAGNOSIS — M109 Gout, unspecified: Secondary | ICD-10-CM

## 2011-02-10 LAB — CBC WITH DIFFERENTIAL/PLATELET
Basophils Absolute: 0 10*3/uL (ref 0.0–0.1)
HCT: 43.5 % (ref 39.0–52.0)
Hemoglobin: 14.9 g/dL (ref 13.0–17.0)
Lymphs Abs: 2 10*3/uL (ref 0.7–4.0)
MCHC: 34.2 g/dL (ref 30.0–36.0)
MCV: 93.7 fl (ref 78.0–100.0)
Monocytes Absolute: 0.4 10*3/uL (ref 0.1–1.0)
Monocytes Relative: 8.3 % (ref 3.0–12.0)
Neutro Abs: 2.2 10*3/uL (ref 1.4–7.7)
RDW: 12.6 % (ref 11.5–14.6)

## 2011-02-10 LAB — COMPREHENSIVE METABOLIC PANEL
ALT: 16 U/L (ref 0–53)
CO2: 30 mEq/L (ref 19–32)
Creatinine, Ser: 1.2 mg/dL (ref 0.4–1.5)
GFR: 76.63 mL/min (ref >60.00)
Total Bilirubin: 0.6 mg/dL (ref 0.3–1.2)

## 2011-02-10 LAB — LIPID PANEL
HDL: 36.6 mg/dL — ABNORMAL LOW (ref >39.00)
Total CHOL/HDL Ratio: 5
Triglycerides: 99 mg/dL (ref 0.0–149.0)

## 2011-02-10 LAB — URIC ACID: Uric Acid, Serum: 6 mg/dL (ref 4.0–7.8)

## 2011-02-17 ENCOUNTER — Encounter: Payer: Self-pay | Admitting: Internal Medicine

## 2011-02-17 ENCOUNTER — Other Ambulatory Visit (INDEPENDENT_AMBULATORY_CARE_PROVIDER_SITE_OTHER): Payer: Medicare Other

## 2011-02-17 ENCOUNTER — Ambulatory Visit (INDEPENDENT_AMBULATORY_CARE_PROVIDER_SITE_OTHER): Payer: Medicare Other | Admitting: Internal Medicine

## 2011-02-17 DIAGNOSIS — Z0189 Encounter for other specified special examinations: Secondary | ICD-10-CM | POA: Insufficient documentation

## 2011-02-17 DIAGNOSIS — M109 Gout, unspecified: Secondary | ICD-10-CM

## 2011-02-17 DIAGNOSIS — R972 Elevated prostate specific antigen [PSA]: Secondary | ICD-10-CM

## 2011-02-17 DIAGNOSIS — B1710 Acute hepatitis C without hepatic coma: Secondary | ICD-10-CM

## 2011-02-17 DIAGNOSIS — Z23 Encounter for immunization: Secondary | ICD-10-CM

## 2011-02-17 DIAGNOSIS — C61 Malignant neoplasm of prostate: Secondary | ICD-10-CM | POA: Insufficient documentation

## 2011-02-17 DIAGNOSIS — I10 Essential (primary) hypertension: Secondary | ICD-10-CM

## 2011-02-17 DIAGNOSIS — Z0000 Encounter for general adult medical examination without abnormal findings: Secondary | ICD-10-CM

## 2011-02-17 DIAGNOSIS — IMO0001 Reserved for inherently not codable concepts without codable children: Secondary | ICD-10-CM

## 2011-02-17 LAB — URINALYSIS, ROUTINE W REFLEX MICROSCOPIC
Ketones, ur: 15
Leukocytes, UA: NEGATIVE
Total Protein, Urine: 100
pH: 6 (ref 5.0–8.0)

## 2011-02-17 LAB — BASIC METABOLIC PANEL
Calcium: 9.5 mg/dL (ref 8.4–10.5)
GFR: 84.8 mL/min (ref >60.00)
Sodium: 135 mEq/L (ref 135–145)

## 2011-02-17 LAB — FECAL OCCULT BLOOD, GUAIAC: Fecal Occult Blood: NEGATIVE

## 2011-02-17 LAB — PSA: PSA: 6.53 ng/mL — ABNORMAL HIGH (ref 0.10–4.00)

## 2011-02-17 LAB — HM DIABETES FOOT EXAM: HM Diabetic Foot Exam: NORMAL

## 2011-02-17 LAB — URIC ACID: Uric Acid, Serum: 6.4 mg/dL (ref 4.0–7.8)

## 2011-02-17 NOTE — Assessment & Plan Note (Signed)

## 2011-02-17 NOTE — Assessment & Plan Note (Signed)
I will recheck his PSA today 

## 2011-02-17 NOTE — Assessment & Plan Note (Signed)
The software would not let me order an AFP due to insurance restrictions so I have asked him to get a u/s done of his liver to look for masses, nodules, etc

## 2011-02-17 NOTE — Assessment & Plan Note (Signed)
His recent a1c shows good control of blood sugars

## 2011-02-17 NOTE — Patient Instructions (Signed)
Health Maintenance, Males A healthy lifestyle and preventative care can promote health and wellness.  Maintain regular health, dental, and eye exams.   Eat a healthy diet. Foods like vegetables, fruits, whole grains, low-fat dairy products, and lean protein foods contain the nutrients you need without too many calories. Decrease your intake of foods high in solid fats, added sugars, and salt. Get information about a proper diet from your caregiver, if necessary.   Regular physical exercise is one of the most important things you can do for your health. Most adults should get at least 150 minutes of moderate-intensity exercise (any activity that increases your heart rate and causes you to sweat) each week. In addition, most adults need muscle-strengthening exercises on 2 or more days a week.    Maintain a healthy weight. The body mass index (BMI) is a screening tool to identify possible weight problems. It provides an estimate of body fat based on height and weight. Your caregiver can help determine your BMI, and can help you achieve or maintain a healthy weight. For adults 20 years and older:   A BMI below 18.5 is considered underweight.   A BMI of 18.5 to 24.9 is normal.   A BMI of 25 to 29.9 is considered overweight.   A BMI of 30 and above is considered obese.   Maintain normal blood lipids and cholesterol by exercising and minimizing your intake of saturated fat. Eat a balanced diet with plenty of fruits and vegetables. Blood tests for lipids and cholesterol should begin at age 20 and be repeated every 5 years. If your lipid or cholesterol levels are high, you are over 50, or you are a high risk for heart disease, you may need your cholesterol levels checked more frequently.Ongoing high lipid and cholesterol levels should be treated with medicines, if diet and exercise are not effective.   If you smoke, find out from your caregiver how to quit. If you do not use tobacco, do not start.    If you choose to drink alcohol, do not exceed 2 drinks per day. One drink is considered to be 12 ounces (355 mL) of beer, 5 ounces (148 mL) of wine, or 1.5 ounces (44 mL) of liquor.   Avoid use of street drugs. Do not share needles with anyone. Ask for help if you need support or instructions about stopping the use of drugs.   High blood pressure causes heart disease and increases the risk of stroke. Blood pressure should be checked at least every 1 to 2 years. Ongoing high blood pressure should be treated with medicines if weight loss and exercise are not effective.   If you are 45 to 75 years old, ask your caregiver if you should take aspirin to prevent heart disease.   Diabetes screening involves taking a blood sample to check your fasting blood sugar level. This should be done once every 3 years, after age 45, if you are within normal weight and without risk factors for diabetes. Testing should be considered at a younger age or be carried out more frequently if you are overweight and have at least 1 risk factor for diabetes.   Colorectal cancer can be detected and often prevented. Most routine colorectal cancer screening begins at the age of 50 and continues through age 75. However, your caregiver may recommend screening at an earlier age if you have risk factors for colon cancer. On a yearly basis, your caregiver may provide home test kits to check for hidden   blood in the stool. Use of a small camera at the end of a tube, to directly examine the colon (sigmoidoscopy or colonoscopy), can detect the earliest forms of colorectal cancer. Talk to your caregiver about this at age 38, when routine screening begins. Direct examination of the colon should be repeated every 5 to 10 years through age 32, unless early forms of pre-cancerous polyps or small growths are found.   Healthy men should no longer receive prostate-specific antigen (PSA) blood tests as part of routine cancer screening. Consult with  your caregiver about prostate cancer screening.   Practice safe sex. Use condoms and avoid high-risk sexual practices to reduce the spread of sexually transmitted infections (STIs).   Use sunscreen with a sun protection factor (SPF) of 30 or greater. Apply sunscreen liberally and repeatedly throughout the day. You should seek shade when your shadow is shorter than you. Protect yourself by wearing long sleeves, pants, a wide-brimmed hat, and sunglasses year round, whenever you are outdoors.   Notify your caregiver of new moles or changes in moles, especially if there is a change in shape or color. Also notify your caregiver if a mole is larger than the size of a pencil eraser.   A one-time screening for abdominal aortic aneurysm (AAA) and surgical repair of large AAAs by sound wave imaging (ultrasonography) is recommended for ages 58 to 8 years who are current or former smokers.   Stay current with your immunizations.  Document Released: 08/06/2007 Document Revised: 10/20/2010 Document Reviewed: 07/05/2010 [facility] Patient Information 2012 [place], [place].Gout Gout is an inflammatory condition (arthritis) caused by a buildup of uric acid crystals in the joints. Uric acid is a chemical that is normally present in the blood. Under some circumstances, uric acid can form into crystals in your joints. This causes joint redness, soreness, and swelling (inflammation). Repeat attacks are common. Over time, uric acid crystals can form into masses (tophi) near a joint, causing disfigurement. Gout is treatable and often preventable. CAUSES  The disease begins with elevated levels of uric acid in the blood. Uric acid is produced by your body when it breaks down a naturally found substance called purines. This also happens when you eat certain foods such as meats and fish. Causes of an elevated uric acid level include:  Being passed down from parent to child (heredity).   Diseases that cause increased uric  acid production (obesity, psoriasis, some cancers).   Excessive alcohol use.   Diet, especially diets rich in meat and seafood.   Medicines, including certain cancer-fighting drugs (chemotherapy), diuretics, and aspirin.   Chronic kidney disease. The kidneys are no longer able to remove uric acid well.   Problems with metabolism.  Conditions strongly associated with gout include:  Obesity.   High blood pressure.   High cholesterol.   Diabetes.  Not everyone with elevated uric acid levels gets gout. It is not understood why some people get gout and others do not. Surgery, joint injury, and eating too much of certain foods are some of the factors that can lead to gout. SYMPTOMS   An attack of gout comes on quickly. It causes intense pain with redness, swelling, and warmth in a joint.   Fever can occur.   Often, only one joint is involved. Certain joints are more commonly involved:   Base of the big toe.   Knee.   Ankle.   Wrist.   Finger.  Without treatment, an attack usually goes away in a few days to  weeks. Between attacks, you usually will not have symptoms, which is different from many other forms of arthritis. DIAGNOSIS  Your caregiver will suspect gout based on your symptoms and exam. Removal of fluid from the joint (arthrocentesis) is done to check for uric acid crystals. Your caregiver will give you a medicine that numbs the area (local anesthetic) and use a needle to remove joint fluid for exam. Gout is confirmed when uric acid crystals are seen in joint fluid, using a special microscope. Sometimes, blood, urine, and X-ray tests are also used. TREATMENT  There are 2 phases to gout treatment: treating the sudden onset (acute) attack and preventing attacks (prophylaxis). Treatment of an Acute Attack  Medicines are used. These include anti-inflammatory medicines or steroid medicines.   An injection of steroid medicine into the affected joint is sometimes necessary.    The painful joint is rested. Movement can worsen the arthritis.   You may use warm or cold treatments on painful joints, depending which works best for you.   Discuss the use of coffee, vitamin C, or cherries with your caregiver. These may be helpful treatment options.  Treatment to Prevent Attacks After the acute attack subsides, your caregiver may advise prophylactic medicine. These medicines either help your kidneys eliminate uric acid from your body or decrease your uric acid production. You may need to stay on these medicines for a very long time. The early phase of treatment with prophylactic medicine can be associated with an increase in acute gout attacks. For this reason, during the first few months of treatment, your caregiver may also advise you to take medicines usually used for acute gout treatment. Be sure you understand your caregiver's directions. You should also discuss dietary treatment with your caregiver. Certain foods such as meats and fish can increase uric acid levels. Other foods such as dairy can decrease levels. Your caregiver can give you a list of foods to avoid. HOME CARE INSTRUCTIONS   Do not take aspirin to relieve pain. This raises uric acid levels.   Only take over-the-counter or prescription medicines for pain, discomfort, or fever as directed by your caregiver.   Rest the joint as much as possible. When in bed, keep sheets and blankets off painful areas.   Keep the affected joint raised (elevated).   Use crutches if the painful joint is in your leg.   Drink enough water and fluids to keep your urine clear or pale yellow. This helps your body get rid of uric acid. Do not drink alcoholic beverages. They slow the passage of uric acid.   Follow your caregiver's dietary instructions. Pay careful attention to the amount of protein you eat. Your daily diet should emphasize fruits, vegetables, whole grains, and fat-free or low-fat milk products.   Maintain a  healthy body weight.  SEEK MEDICAL CARE IF:   You have an oral temperature above 102 F (38.9 C).   You develop diarrhea, vomiting, or any side effects from medicines.   You do not feel better in 24 hours, or you are getting worse.  SEEK IMMEDIATE MEDICAL CARE IF:   Your joint becomes suddenly more tender and you have:   Chills.   An oral temperature above 102 F (38.9 C), not controlled by medicine.  MAKE SURE YOU:   Understand these instructions.   Will watch your condition.   Will get help right away if you are not doing well or get worse.  Document Released: 02/05/2000 Document Revised: 10/20/2010 Document Reviewed: 05/18/2009  [employer] Patient Information 2012 [place].

## 2011-02-17 NOTE — Assessment & Plan Note (Signed)
I will check his renal function and uric acid level today

## 2011-02-17 NOTE — Assessment & Plan Note (Signed)
His BP is well controlled 

## 2011-02-17 NOTE — Progress Notes (Signed)
Subjective:    Patient ID: Dakota Scott, male    DOB: [birth year], 77 y.o.   MRN: [id number]  HPI New to me for a physical. He has been seen in the ER recently for a gout flare in his right knee but that has resolved and he feels well today with no complaints.   Review of Systems  Constitutional: Negative for fever, chills, diaphoresis, activity change, appetite change, fatigue and unexpected weight change.  HENT: Negative.   Eyes: Negative.   Respiratory: Negative for cough, chest tightness, shortness of breath, wheezing and stridor.   Cardiovascular: Negative for chest pain, palpitations and leg swelling.  Gastrointestinal: Negative for nausea, vomiting, abdominal pain, diarrhea and constipation.  Genitourinary: Negative for dysuria, urgency, frequency, hematuria, flank pain, decreased urine volume, discharge, penile swelling, scrotal swelling, enuresis, difficulty urinating, genital sores, penile pain and testicular pain.  Musculoskeletal: Positive for arthralgias (knees). Negative for back pain, joint swelling and gait problem.  Skin: Negative for color change and rash.  Neurological: Negative.   Hematological: Negative for adenopathy. Does not bruise/bleed easily.  Psychiatric/Behavioral: Negative.        Objective:   Physical Exam  Vitals reviewed. Constitutional: He is oriented to person, place, and time. He appears well-developed and well-nourished. No distress.  HENT:  Head: Normocephalic and atraumatic.  Mouth/Throat: Oropharynx is clear and moist. No oropharyngeal exudate.  Eyes: Conjunctivae are normal. Right eye exhibits no discharge. Left eye exhibits no discharge. No scleral icterus.  Neck: Normal range of motion. Neck supple. No JVD present. No tracheal deviation present. No thyromegaly present.  Cardiovascular: Normal rate, regular rhythm, normal heart sounds and intact distal pulses.  Exam reveals no gallop and no friction rub.   No murmur  heard. Pulmonary/Chest: Effort normal and breath sounds normal. No stridor. No respiratory distress. He has no wheezes. He has no rales. He exhibits no tenderness.  Abdominal: Soft. Bowel sounds are normal. He exhibits no distension and no mass. There is no tenderness. There is no rebound and no guarding. Hernia confirmed negative in the right inguinal area and confirmed negative in the left inguinal area.  Genitourinary: Testes normal and penis normal. Rectal exam shows no external hemorrhoid, no internal hemorrhoid, no fissure, no mass, no tenderness and anal tone normal. Guaiac negative stool. Prostate is enlarged (right side has an assymetrical hypertrophy). Prostate is not tender. Right testis shows no mass, no swelling and no tenderness. Right testis is descended. Left testis shows no mass, no swelling and no tenderness. Left testis is descended. Circumcised. No penile tenderness. No discharge found.  Musculoskeletal: Normal range of motion. He exhibits no edema and no tenderness.  Lymphadenopathy:    He has no cervical adenopathy.       Right: No inguinal adenopathy present.       Left: No inguinal adenopathy present.  Neurological: He is oriented to person, place, and time.  Skin: Skin is warm and dry. No rash noted. He is not diaphoretic. No erythema. No pallor.  Psychiatric: He has a normal mood and affect. His behavior is normal. Judgment and thought content normal.      Lab Results  Component Value Date   WBC 4.7 02/10/2011   HGB 14.9 02/10/2011   HCT 43.5 02/10/2011   PLT 207.0 02/10/2011   GLUCOSE 241* 02/10/2011   CHOL 177 02/10/2011   TRIG 99.0 02/10/2011   HDL 36.60* 02/10/2011   LDLCALC 121* 02/10/2011   ALT 16 02/10/2011   AST 23 02/10/2011  NA 138 02/10/2011   K 4.0 02/10/2011   CL 102 02/10/2011   CREATININE 1.2 02/10/2011   BUN 17 02/10/2011   CO2 30 02/10/2011   TSH 1.75 09/27/2006   PSA 5.46* 09/27/2006   HGBA1C 7.1* 02/10/2011     Assessment & Plan:

## 2011-02-21 ENCOUNTER — Other Ambulatory Visit: Payer: Self-pay | Admitting: Internal Medicine

## 2011-02-21 ENCOUNTER — Ambulatory Visit
Admission: RE | Admit: 2011-02-21 | Discharge: 2011-02-21 | Disposition: A | Discharge status: Home / Self Care | Payer: Medicare Other | Source: Ambulatory Visit | Attending: Internal Medicine | Admitting: Internal Medicine

## 2011-02-21 DIAGNOSIS — I10 Essential (primary) hypertension: Secondary | ICD-10-CM

## 2011-02-21 DIAGNOSIS — Z23 Encounter for immunization: Secondary | ICD-10-CM

## 2011-02-21 DIAGNOSIS — R972 Elevated prostate specific antigen [PSA]: Secondary | ICD-10-CM

## 2011-02-21 DIAGNOSIS — B1710 Acute hepatitis C without hepatic coma: Secondary | ICD-10-CM

## 2011-02-21 DIAGNOSIS — Z1211 Encounter for screening for malignant neoplasm of colon: Secondary | ICD-10-CM

## 2011-03-16 ENCOUNTER — Encounter: Payer: Self-pay | Admitting: Internal Medicine

## 2011-05-16 ENCOUNTER — Other Ambulatory Visit (HOSPITAL_COMMUNITY): Payer: Self-pay | Admitting: Internal Medicine

## 2012-03-26 ENCOUNTER — Other Ambulatory Visit: Payer: Self-pay | Admitting: Internal Medicine

## 2012-03-27 ENCOUNTER — Other Ambulatory Visit: Payer: Self-pay | Admitting: Internal Medicine

## 2013-03-08 ENCOUNTER — Other Ambulatory Visit: Payer: Self-pay | Admitting: Internal Medicine

## 2013-10-08 ENCOUNTER — Ambulatory Visit (INDEPENDENT_AMBULATORY_CARE_PROVIDER_SITE_OTHER): Payer: Medicare Other | Admitting: Neurology

## 2013-10-08 ENCOUNTER — Encounter: Payer: Self-pay | Admitting: Neurology

## 2013-10-08 VITALS — BP 156/79 | HR 90 | Ht 68.0 in | Wt 191.0 lb

## 2013-10-08 DIAGNOSIS — G250 Essential tremor: Secondary | ICD-10-CM

## 2013-10-08 DIAGNOSIS — G252 Other specified forms of tremor: Secondary | ICD-10-CM

## 2013-10-08 NOTE — Patient Instructions (Signed)
I had a long discussion with the patient regarding his right hand mild action tremor which seems to be limited to certain tasks like writing at the present time and is not functionally disabling. Hence I did not recommend specific treatment medications but we will evaluate him further by checking MR scan of the brain, TSH, B12 and ESR. Return for followup in 2 months or call earlier if necessary  Tremor Tremor is a rhythmic, involuntary muscular contraction characterized by oscillations (to-and-fro movements) of a part of the body. The most common of all involuntary movements, tremor can affect various body parts such as the hands, head, facial structures, vocal cords, trunk, and legs; most tremors, however, occur in the hands. Tremor often accompanies neurological disorders associated with aging. Although the disorder is not life-threatening, it can be responsible for functional disability and social embarrassment. TREATMENT  There are many types of tremor and several ways in which tremor is classified. The most common classification is by behavioral context or position. There are five categories of tremor within this classification: resting, postural, kinetic, task-specific, and psychogenic. Resting or static tremor occurs when the muscle is at rest, for example when the hands are lying on the lap. This type of tremor is often seen in patients with Parkinson's disease. Postural tremor occurs when a patient attempts to maintain posture, such as holding the hands outstretched. Postural tremors include physiological tremor, essential tremor, tremor with basal ganglia disease (also seen in patients with Parkinson's disease), cerebellar postural tremor, tremor with peripheral neuropathy, post-traumatic tremor, and alcoholic tremor. Kinetic or intention (action) tremor occurs during purposeful movement, for example during finger-to-nose testing. Task-specific tremor appears when performing goal-oriented tasks such  as handwriting, speaking, or standing. This group consists of primary writing tremor, vocal tremor, and orthostatic tremor. Psychogenic tremor occurs in both older and younger patients. The key feature of this tremor is that it dramatically lessens or disappears when the patient is distracted. PROGNOSIS There are some treatment options available for tremor; the appropriate treatment depends on accurate diagnosis of the cause. Some tremors respond to treatment of the underlying condition, for example in some cases of psychogenic tremor treating the patient's underlying mental problem may cause the tremor to disappear. Also, patients with tremor due to Parkinson's disease may be treated with Levodopa drug therapy. Symptomatic drug therapy is available for several other tremors as well. For those cases of tremor in which there is no effective drug treatment, physical measures such as teaching the patient to brace the affected limb during the tremor are sometimes useful. Surgical intervention such as thalamotomy or deep brain stimulation may be useful in certain cases. Document Released: 01/28/2002 Document Revised: 05/02/2011 Document Reviewed: 02/07/2005 [facility] Patient Information 2015 [place], [place]. This information is not intended to replace advice given to you by your health care provider. Make sure you discuss any questions you have with your health care provider.

## 2013-10-08 NOTE — Progress Notes (Signed)
[facility] [address] [place]. [place] [address] [phone]       OFFICE CONSULT NOTE  Dakota Scott Date of Birth:  January 31, 1934 Medical Record Number:  [id number]   Referring MD:  Doristine Section Bonsu  Reason for Referral:  Hand tremor  HPI: Dakota Scott is a 35 year [place] African male from [place] who has been having mild right hand tremor for the last one year or so. He has noticed this mainly when signing documents.He feels it  is difficult for him to write and his hand is not steady. He has to hold his right hand with the left while he can sign. He denies micrographia. He denies a tremor in any other activities like buttoning clothes holding a newspaper or a cup of coffee. He denies any pain, discomfort or cramp like sensation in his hand .He denies drooling of saliva, bradykinesia, gait or balance difficulties or festination. He denies any disturbed sleep or hallucinations. He states his quit drinking wine 6 months ago but he had not noticed any effect of alcohol on sternal. Is no family history of benign essential tremor. He does have diabetes which he feels is fairly well controlled. He does have mild paresthesias in his feet but denies gait or balance walking difficulties. He denies any exposure to heavy metals or toxins or abusing recreational drugs  ROS:   14 system review of systems is positive for hand tremor, leg swelling, hearing loss, blurred vision, headache and joint pain only and all other systems  PMH:  Past Medical History  Diagnosis Date  . Gout   . Hypertension   . Acute hepatitis C without mention of hepatic coma     Social History:  History   Social History  . Marital Status: Married    Spouse Name: N/A    Number of Children: N/A  . Years of Education: N/A   Occupational History  . Not on file.   Social History Main Topics  . Smoking status: Never Smoker   . Smokeless tobacco: Not on file  . Alcohol Use: 6.0 oz/week    5  Glasses of wine, 5 Cans of beer per week  . Drug Use: No  . Sexual Activity: Not Currently   Other Topics Concern  . 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 allopurinol (ZYLOPRIM) 100 MG tablet TAKE 1 TABLET BY MOUTH DAILY.  90 tablet  3  . amLODipine (NORVASC) 10 MG tablet TAKE 1 TABLET BY MOUTH DAILY.  90 tablet  3  . aspirin EC 81 MG tablet Take 81 mg by mouth daily.        . colchicine 0.6 MG tablet At onset of gout flare take 2 tablets, may take 1 tablet 1 hr later if not relieved. May do this daily up to three days.  30 tablet  1  . COLCRYS 0.6 MG tablet TAKE 1 TABLET BY MOUTH DAILY  90 tablet  1   No current facility-administered medications on file prior to visit.    Allergies:  No Known Allergies  Physical Exam General: well developed, well nourished elderly  [place] african male, seated, in no evident distress Head: head normocephalic and atraumatic. Orohparynx benign Neck: supple with no carotid or supraclavicular bruits Cardiovascular: regular rate and rhythm, no murmurs Musculoskeletal: no deformity Skin:  no rash/petichiae Vascular:  Normal pulses all extremities Filed Vitals:   10/08/13 1325  BP: 156/79  Pulse: 90    Neurologic Exam Mental Status: Awake and fully alert. Oriented to place and time. Recent and remote memory intact. Attention span, concentration and fund of knowledge appropriate. Mood and affect appropriate.  Cranial Nerves: Fundoscopic exam reveals sharp disc margins. Pupils equal, briskly reactive to light. Extraocular movements full without nystagmus. Visual fields full to confrontation. Hearing intact. Facial sensation intact. Face, tongue, palate  moves normally and symmetrically. No masked  facies. Glabellar tap is positive. No primitive reflexes Motor: Normal bulk and tone. Normal strength in all tested extremity muscles. Sensory.: Diminished touch pinprick, position and vibratory sensation from ankle down. Romberg sign is negative Coordination: Rapid alternating movements normal in all extremities. Finger-to-nose and heel-to-shin performed accurately bilaterally. Gait and Station: Arises from chair without difficulty. Stance is normal. Gait demonstrates normal stride length and balance . Able to heel, toe and tandem walk without difficulty. No diminished arms wing or festination or retropulsion. Good postural response to threat Reflexes: 1+ and symmetric depressed and absent at ankles bilaterally. Toes downgoing.       ASSESSMENT: 71 year [place] African male with mild right hand action tremor which appears to be task specific related to writing which is not functionally disabling. Etiology is indeterminate at the current time but he does not have associated features to suggest Parkinson's disease or focal hand dystonia. He has mild diabetic sensory neuropathy.     PLAN: I had a long discussion with the patient regarding his right hand mild action tremor which seems to be limited to certain tasks like writing at the present time and is not functionally disabling. Hence I did not recommend specific treatment medications but we will evaluate him further by checking [place] scan of the brain, TSH, B12 and ESR. Return for followup in 2 months or call earlier if necessary    Note: This document was prepared with digital dictation and possible smart phrase technology. Any transcriptional errors that result from this process are unintentional.

## 2013-10-09 LAB — COMPREHENSIVE METABOLIC PANEL
A/G RATIO: 1.4 (ref 1.1–2.5)
ALBUMIN: 4.5 g/dL (ref 3.5–4.8)
ALK PHOS: 79 IU/L (ref 39–117)
ALT: 23 IU/L (ref 0–44)
AST: 26 IU/L (ref 0–40)
BUN / CREAT RATIO: 17 (ref 10–22)
BUN: 21 mg/dL (ref 8–27)
CO2: 26 mmol/L (ref 18–29)
CREATININE: 1.23 mg/dL (ref 0.76–1.27)
Calcium: 9.9 mg/dL (ref 8.6–10.2)
Chloride: 96 mmol/L — ABNORMAL LOW (ref 97–108)
GFR, EST AFRICAN AMERICAN: 65 mL/min/{1.73_m2} (ref >59)
GFR, EST NON AFRICAN AMERICAN: 56 mL/min/{1.73_m2} — AB (ref >59)
GLOBULIN, TOTAL: 3.3 g/dL (ref 1.5–4.5)
Glucose: 113 mg/dL — ABNORMAL HIGH (ref 65–99)
Potassium: 4 mmol/L (ref 3.5–5.2)
Sodium: 139 mmol/L (ref 134–144)
Total Bilirubin: 0.6 mg/dL (ref 0.0–1.2)
Total Protein: 7.8 g/dL (ref 6.0–8.5)

## 2013-10-09 LAB — SEDIMENTATION RATE: SED RATE: 4 mm/h (ref 0–30)

## 2013-10-09 LAB — TSH: TSH: 2.38 u[IU]/mL (ref 0.450–4.500)

## 2013-10-09 LAB — VITAMIN B12: Vitamin B-12: 1164 pg/mL — ABNORMAL HIGH (ref 211–946)

## 2013-10-09 LAB — AMMONIA: Ammonia: 81 ug/dL (ref 27–102)

## 2013-10-17 ENCOUNTER — Ambulatory Visit
Admission: RE | Admit: 2013-10-17 | Discharge: 2013-10-17 | Disposition: A | Discharge status: Home / Self Care | Payer: Medicare Other | Source: Ambulatory Visit | Attending: Neurology | Admitting: Neurology

## 2013-10-17 DIAGNOSIS — R279 Unspecified lack of coordination: Secondary | ICD-10-CM

## 2013-10-17 DIAGNOSIS — G252 Other specified forms of tremor: Secondary | ICD-10-CM

## 2013-10-17 MED ORDER — GADOBENATE DIMEGLUMINE 529 MG/ML IV SOLN
17.0000 mL | Freq: Once | INTRAVENOUS | Status: AC | PRN
  Administered 2013-10-17: 17 mL via INTRAVENOUS

## 2013-10-23 NOTE — Progress Notes (Signed)
Quick Note:  I called pt and gave results or MRI brain. (mild chronic SVD). Nothing further ordered. ______

## 2013-12-16 ENCOUNTER — Encounter: Payer: Self-pay | Admitting: Nurse Practitioner

## 2013-12-16 ENCOUNTER — Ambulatory Visit (INDEPENDENT_AMBULATORY_CARE_PROVIDER_SITE_OTHER): Payer: BC Managed Care – PPO | Admitting: Nurse Practitioner

## 2013-12-16 VITALS — BP 146/80 | HR 76 | Temp 96.8°F | Ht 66.0 in | Wt 190.0 lb

## 2013-12-16 DIAGNOSIS — G252 Other specified forms of tremor: Secondary | ICD-10-CM

## 2013-12-16 NOTE — Patient Instructions (Signed)
Right hand mild action tremor which seems to be limited to certain tasks like writing at the present time and is not functionally disabling. Hence I did not recommend specific treatment medications. MR scan of the brain, TSH, B12 and ESR were unremarkable.  We will continue to follow you symptoms over time.  Return for followup in 6 months with Dr. Leonie Man or call earlier if necessary.  Tremor Tremor is a rhythmic, involuntary muscular contraction characterized by oscillations (to-and-fro movements) of a part of the body. The most common of all involuntary movements, tremor can affect various body parts such as the hands, head, facial structures, vocal cords, trunk, and legs; most tremors, however, occur in the hands. Tremor often accompanies neurological disorders associated with aging. Although the disorder is not life-threatening, it can be responsible for functional disability and social embarrassment.  TREATMENT  There are many types of tremor and several ways in which tremor is classified. The most common classification is by behavioral context or position. There are five categories of tremor within this classification: resting, postural, kinetic, task-specific, and psychogenic. Resting or static tremor occurs when the muscle is at rest, for example when the hands are lying on the lap. This type of tremor is often seen in patients with Parkinson's disease. Postural tremor occurs when a patient attempts to maintain posture, such as holding the hands outstretched. Postural tremors include physiological tremor, essential tremor, tremor with basal ganglia disease (also seen in patients with Parkinson's disease), cerebellar postural tremor, tremor with peripheral neuropathy, post-traumatic tremor, and alcoholic tremor. Kinetic or intention (action) tremor occurs during purposeful movement, for example during finger-to-nose testing. Task-specific tremor appears when performing goal-oriented tasks such as  handwriting, speaking, or standing. This group consists of primary writing tremor, vocal tremor, and orthostatic tremor. Psychogenic tremor occurs in both older and younger patients. The key feature of this tremor is that it dramatically lessens or disappears when the patient is distracted.  PROGNOSIS There are some treatment options available for tremor; the appropriate treatment depends on accurate diagnosis of the cause. Some tremors respond to treatment of the underlying condition, for example in some cases of psychogenic tremor treating the patient's underlying mental problem may cause the tremor to disappear. Also, patients with tremor due to Parkinson's disease may be treated with Levodopa drug therapy. Symptomatic drug therapy is available for several other tremors as well. For those cases of tremor in which there is no effective drug treatment, physical measures such as teaching the patient to brace the affected limb during the tremor are sometimes useful. Surgical intervention such as thalamotomy or deep brain stimulation may be useful in certain cases. Document Released: 01/28/2002 Document Revised: 05/02/2011 Document Reviewed: 02/07/2005 [facility] Patient Information 2015 [place], [place]. This information is not intended to replace advice given to you by your health care provider. Make sure you discuss any questions you have with your health care provider.

## 2013-12-16 NOTE — Progress Notes (Signed)
PATIENT: Dakota Scott DOB: [birth year]  REASON FOR VISIT: routine follow up for hand tremor HISTORY FROM: patient and daughter  HISTORY OF PRESENT ILLNESS: Dakota Scott is a 40 year [place] African male from [place] who has been having mild right hand tremor for the last one year or so. He has noticed this mainly when signing documents. He feels it is difficult for him to write and his hand is not steady. He has to hold his right hand with the left while he can sign. He denies micrographia. He denies a tremor in any other activities like buttoning clothes holding a newspaper or a cup of coffee. He denies any pain, discomfort or cramp like sensation in his hand. He denies drooling of saliva, bradykinesia, gait or balance difficulties or festination. He denies any disturbed sleep or hallucinations. He states his quit drinking wine 6 months ago but he had not noticed any effect of alcohol on the tremor. There is no family history of benign essential tremor. He does have diabetes which he feels is fairly well controlled. He does have mild paresthesias in his feet but denies gait or balance walking difficulties. He denies any exposure to heavy metals or toxins or abusing recreational drugs.   UPDATE 12/16/13 (LL): He returns for followup, last visit was 3 months ago with Dr. Leonie Man. MRI scan of the brain, TSH, B12 and ESR were unremarkable. Since last visit, the tremor disappeared.  His daughter who accompanies him thinks it may have been a medication side effect as he was diagnosed with hypertension and diabetes earlier this year and started on several new medications. He has no new complaints today.  REVIEW OF SYSTEMS: Full 14 system review of systems performed and notable only for: no complaints   ALLERGIES: No Known Allergies  HOME MEDICATIONS: Outpatient Prescriptions Prior to Visit  Medication Sig Dispense Refill  . allopurinol (ZYLOPRIM) 100 MG tablet TAKE 1 TABLET BY MOUTH DAILY.  90 tablet   3  . amLODipine (NORVASC) 10 MG tablet TAKE 1 TABLET BY MOUTH DAILY.  90 tablet  3  . aspirin EC 81 MG tablet Take 81 mg by mouth daily.        . Cholecalciferol (D3-1000) 1000 UNITS capsule Take 1,000 Units by mouth daily.      . colchicine 0.6 MG tablet At onset of gout flare take 2 tablets, may take 1 tablet 1 hr later if not relieved. May do this daily up to three days.  30 tablet  1  . COLCRYS 0.6 MG tablet TAKE 1 TABLET BY MOUTH DAILY  90 tablet  1  . glimepiride (AMARYL) 4 MG tablet Take 4 mg by mouth daily with breakfast.      . losartan (COZAAR) 25 MG tablet Take 25 mg by mouth daily.      . metFORMIN (GLUMETZA) 1000 MG (MOD) 24 hr tablet Take 1,000 mg by mouth daily with breakfast.      . Multiple Vitamins-Minerals (CENTRUM SILVER PO) Take 1 tablet by mouth daily.      . vitamin B-12 (CYANOCOBALAMIN) 1000 MCG tablet Take 1,000 mcg by mouth daily.       No facility-administered medications prior to visit.    PHYSICAL EXAM Filed Vitals:   12/16/13 1347  BP: 146/80  Pulse: 76  Temp: 96.8 F (36 C)  TempSrc: Oral  Height: _0  (1.676 m)  Weight: 190 lb (86.183 kg)   Body mass index is 30.68 kg/(m^2).  Physical Exam  General: well developed, well nourished elderly [place] african male, seated, in no evident distress  Head: head normocephalic and atraumatic. Orohparynx benign  Neck: supple with no carotid or supraclavicular bruits  Cardiovascular: regular rate and rhythm, no murmurs  Musculoskeletal: no deformity  Skin: no rash/petichiae  Vascular: Normal pulses all extremities   Neurologic Exam  Mental Status: Awake and fully alert. Oriented to place and time. Recent and remote memory intact. Attention span, concentration and fund of knowledge appropriate. Mood and affect appropriate.  Cranial Nerves: Fundoscopic exam not done. Pupils equal, briskly reactive to light. Extraocular movements full without nystagmus. Visual fields full to confrontation. Hearing intact. Facial  sensation intact. Face, tongue, palate moves normally and symmetrically. No masked facies. Glabellar tap is positive. No primitive reflexes  Motor: Normal bulk and tone. Normal strength in all tested extremity muscles. No tremor noted. Sensory: Diminished touch pinprick, position and vibratory sensation from ankle down. Romberg sign is negative  Coordination: Rapid alternating movements normal in all extremities. Finger-to-nose and heel-to-shin performed accurately bilaterally.  Gait and Station: Arises from chair without difficulty. Stance is normal. Gait demonstrates normal stride length and balance . Able to heel, toe and tandem walk without difficulty. No diminished arms wing or festination or retropulsion. Good postural response to threat  Reflexes: 1+ and symmetric depressed and absent at ankles bilaterally. Toes downgoing.    ASSESSMENT: 63 year [place] African male with mild right hand action tremor previously which appeared to be task specific related to writing which is not functionally disabling. The tremor has since disappeared, it may have been possibly related to medication side effect. He does not have associated features to suggest Parkinson's disease or focal hand dystonia. He has mild diabetic sensory neuropathy.   PLAN:  I had a long discussion with the patient and his daughter regarding his right hand mild action tremor which seems to have resolved on its own.  May have been caused by new medications as he was recently diagnosed with hypertension and diabetes and started on new medications. Dakota scan of the brain, TSH, B12 and ESR were unremarkable. We can continue to follow him in the future and if the tremor returns we can treat it then. Return for followup in 6 months or call earlier if necessary.  Rudi Rummage Jimmy Stipes, MSN, FNP-BC, A/GNP-C 12/16/2013, 1:59 PM [facility] [address] [place], [place] [address] [phone]  Note: This document was prepared  with digital dictation and possible smart phrase technology. Any transcriptional errors that result from this process are unintentional.

## 2013-12-17 NOTE — Progress Notes (Signed)
I agree with the above plan 

## 2014-07-29 ENCOUNTER — Ambulatory Visit: Payer: Medicare Other | Admitting: Neurology

## 2015-07-21 ENCOUNTER — Encounter: Payer: Self-pay | Admitting: Radiation Oncology

## 2015-07-21 ENCOUNTER — Ambulatory Visit: Payer: Medicare Other

## 2015-07-21 ENCOUNTER — Telehealth: Payer: Self-pay | Admitting: Radiation Oncology

## 2015-07-21 ENCOUNTER — Ambulatory Visit
Admission: RE | Admit: 2015-07-21 | Discharge: 2015-07-21 | Disposition: A | Discharge status: Home / Self Care | Payer: Medicare Other | Source: Ambulatory Visit | Attending: Radiation Oncology | Admitting: Radiation Oncology

## 2015-07-21 VITALS — Wt 186.0 lb

## 2015-07-21 DIAGNOSIS — C61 Malignant neoplasm of prostate: Secondary | ICD-10-CM | POA: Insufficient documentation

## 2015-07-21 HISTORY — DX: Malignant neoplasm of prostate: C61

## 2015-07-21 NOTE — Telephone Encounter (Signed)
Phoned mobile number listed for patient. Patient's son, Marcy Siren, answered. He reports he was unaware his father had an appointment. Valery plans to reach out to his father and call this RN back to reschedule today's consult. Informed Dr. Tammi Klippel of these findings.

## 2015-07-21 NOTE — Progress Notes (Signed)
GU Location of Tumor / Histology: prostatic adenocarcinoma  If Prostate Cancer, Gleason Score is (4 + 3) and PSA is (7.45) on 10/20/14  Dakota Scott presented in July 2015 with a PSA of 4.9 then, October it rose to 5.3.  Biopsies of prostate (if applicable) revealed:    Past/Anticipated interventions by urology, if any: biopsy, CT pelvis, referral to Dr. Tammi Klippel  Past/Anticipated interventions by medical oncology, if any: no  Weight changes, if any: no  Bowel/Bladder complaints, if any: IPSS 13. Reports nocturia x 3-4. Denies dysuria or hematuria. Denies leakage or incontinence. Reports incomplete emptying and weak stream more than half the time. Reports frequency less than half the time.  Nausea/Vomiting, if any: no  Pain issues, if any:  no  SAFETY ISSUES:  Prior radiation? no  Pacemaker/ICD? no  Possible current pregnancy? no  Is the patient on methotrexate? no  Current Complaints / other details:  82 year old male. Married for 27 years with 2 son and six daughters. Retired. Father passed at 69 and mother at the age of 101. Daughter is a Marine scientist. Patient is leaning toward surgery but, [name] is encouraging IMRT plus ADT. Prostate volume: 101.19 cc. Patient complains of burning pain and edema in both feet related to diabetic neuropathy. Reports gabapentin was tried to alleviate but, caused the patient weakness so he stopped. Denies hot flashes or unintentional weight loss. Patient is HOH. [name] any family hx of cancer.

## 2015-08-03 ENCOUNTER — Ambulatory Visit
Admission: RE | Admit: 2015-08-03 | Discharge: 2015-08-03 | Disposition: A | Discharge status: Home / Self Care | Payer: Medicare Other | Source: Ambulatory Visit | Attending: Radiation Oncology | Admitting: Radiation Oncology

## 2015-08-03 ENCOUNTER — Encounter: Payer: Self-pay | Admitting: Radiation Oncology

## 2015-08-03 VITALS — BP 158/84 | HR 80 | Resp 16 | Ht 66.0 in | Wt 193.0 lb

## 2015-08-03 DIAGNOSIS — C61 Malignant neoplasm of prostate: Secondary | ICD-10-CM

## 2015-08-03 NOTE — Progress Notes (Signed)
Radiation Oncology         [phone] ________________________________  Initial Outpatient Consultation  Name: Dakota Scott MRN: [id number]  Date: 08/03/2015  DOB: November 10, 1933  [id number], MD  Festus Aloe, MD   REFERRING PHYSICIAN: Festus Aloe, MD  DIAGNOSIS: 82 y.o. gentleman with stage T1c adenocarcinoma of the prostate with a Gleason's score of 4+3 and a PSA of 7.45    ICD-9-CM ICD-10-CM   1. Malignant neoplasm of prostate (Dakota Scott) Dakota Scott is a 82 y.o. gentleman.  He was noted to have an elevated PSA of 5.3 in 12/2013 and 4.9 in 08/2014 by his primary care physician, Dr. Orma Render.  Accordingly, he was referred for evaluation in urology by Dr. Janice Norrie on 10/20/14,  digital rectal examination was performed at that time revealing no nodularity.  PSA draw on 10/20/14 was 7.45   The patient proceeded to transrectal ultrasound with 12 biopsies of the prostate on 11/14/14.  The prostate volume measured 101.19 cc.  Out of 12 core biopsies,5 were positive.  The maximum Gleason score was 4+3 and this was seen in the left base lateral, Gleason score 3+4 was seen in the left apex lateral, and Gleason score 3+3 was seen in the left base, right apex, and right apex lateral.  The patient returned to urology on 11/21/15 to discuss options of treatment. Dr. Janice Norrie did not believe the candidate was as good candidate for active surveillance or radical prostatectomy.  The patient's current urologist is Dr. Junious Silk and a repeat PSA on 07/13/15 was 6.23. Dr. Junious Silk recommended IMRT with androgen depravation therapy. 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 No metastatic disease was identified.  The patient reviewed the biopsy results with his urologist and he has kindly been referred today for discussion of potential  radiation treatment options.  PREVIOUS RADIATION THERAPY: No  PAST MEDICAL HISTORY:  has a past medical history of Gout; Hypertension; Acute hepatitis C without mention of hepatic coma; and Prostate cancer ([facility]).    PAST SURGICAL HISTORY: Past Surgical History  Procedure Laterality Date  . Eye surgery  2009    cataract    FAMILY HISTORY: family history is negative for Hyperlipidemia, Heart disease, Diabetes, and Cancer.  SOCIAL HISTORY:  reports that he has never smoked. He has never used smokeless tobacco. He reports that he drinks about 6.0 oz of alcohol per week. He reports that he does not use illicit drugs.  ALLERGIES: Neurontin  MEDICATIONS:  Current Outpatient Prescriptions  Medication Sig Dispense Refill  . allopurinol (ZYLOPRIM) 100 MG tablet TAKE 1 TABLET BY MOUTH DAILY. 90 tablet 3  . amLODipine (NORVASC) 10 MG tablet TAKE 1 TABLET BY MOUTH DAILY. 90 tablet 3  . aspirin EC 81 MG tablet Take 81 mg by mouth daily.      . Cholecalciferol (D3-1000) 1000 UNITS capsule Take 1,000 Units by mouth daily.    . colchicine 0.6 MG tablet At onset of gout flare take 2 tablets, may take 1 tablet 1 hr later if not relieved. May do this daily up to three days. 30 tablet 1  . COLCRYS 0.6 MG tablet TAKE 1 TABLET BY MOUTH DAILY 90 tablet 1  . glimepiride (AMARYL) 4 MG tablet Take 4 mg by mouth daily with breakfast.    . losartan (COZAAR) 25 MG tablet  Take 25 mg by mouth daily.    . metFORMIN (GLUMETZA) 1000 MG (MOD) 24 hr tablet Take 1,000 mg by mouth daily with breakfast.    . Multiple Vitamins-Minerals (CENTRUM SILVER PO) Take 1 tablet by mouth daily.    . pravastatin (PRAVACHOL) 20 MG tablet     . vitamin B-12 (CYANOCOBALAMIN) 1000 MCG tablet Take 1,000 mcg by mouth daily.     No current facility-administered medications for this encounter.    REVIEW OF SYSTEMS:  A 15 point review of systems is documented in the electronic medical record. This was obtained by the nursing staff.  However, I reviewed this with the patient to discuss relevant findings and make appropriate changes.  Pertinent items are noted in HPI..  The patient completed an IPSS and IIEF questionnaire.  His IPSS score was 13 indicating moderate urinary outflow obstructive symptoms.  He indicated that his erectile function is able to complete sexual activity about half the time.   PHYSICAL EXAM: This patient is in no acute distress.  He is alert and oriented.   height is 5\' 6"  (1.676 m) and weight is 193 lb (87.544 kg). His blood pressure is 158/84 and his pulse is 80. His respiration is 16 and oxygen saturation is 100%.  He exhibits no respiratory distress or labored breathing.  He appears neurologically intact.  His mood is pleasant.  His affect is appropriate.  Please note the digital rectal exam findings described above. The patient is hard of hearing.  KPS = 100  100 - Normal; no complaints; no evidence of disease. 90   - Able to carry on normal activity; minor signs or symptoms of disease. 80   - Normal activity with effort; some signs or symptoms of disease. 28   - Cares for self; unable to carry on normal activity or to do active work. 60   - Requires occasional assistance, but is able to care for most of his personal needs. 50   - Requires considerable assistance and frequent medical care. 21   - Disabled; requires special care and assistance. 34   - Severely disabled; hospital admission is indicated although death not imminent. 39   - Very sick; hospital admission necessary; active supportive treatment necessary. 10   - Moribund; fatal processes progressing rapidly. 0     - Dead  Karnofsky DA, Abelmann [place], Craver LS and Burchenal [facility] [phone]) The use of the nitrogen mustards in the palliative treatment of carcinoma: with particular reference to bronchogenic carcinoma Cancer 1 634-56   LABORATORY DATA:  Lab Results  Component Value Date   WBC 4.7 02/10/2011   HGB 14.9 02/10/2011   HCT 43.5  02/10/2011   MCV 93.7 02/10/2011   PLT 207.0 02/10/2011   Lab Results  Component Value Date   NA 139 10/08/2013   K 4.0 10/08/2013   CL 96* 10/08/2013   CO2 26 10/08/2013   Lab Results  Component Value Date   ALT 23 10/08/2013   AST 26 10/08/2013   ALKPHOS 79 10/08/2013   BILITOT 0.6 10/08/2013     RADIOGRAPHY: No results found.    IMPRESSION: This gentleman is a 82 y.o. gentleman with stage T1c adenocarcinoma of the prostate with a Gleason's score of 4+3 and a PSA of 7.45.  His T-Stage, Gleason's Score, and PSA put him into the intermediate risk group.  Accordingly he is eligible for external beam radiation with androgen depravation therapy.  PLAN: Today I reviewed the findings and workup thus  far.  We discussed the natural history of prostate cancer.  We reviewed the the implications of T-stage, Gleason's Score, and PSA on decision-making and outcomes in prostate cancer.  We discussed radiation treatment in the management of prostate cancer with regard to the logistics and delivery of external beam radiation treatment as well as the logistics and delivery of prostate brachytherapy.  We compared and contrasted each of these approaches and also compared these against prostatectomy.  The patient expressed interest in external beam radiotherapy.  I filled out a patient counseling form for him with relevant treatment diagrams and we retained a copy for our records.   The patient would like to proceed with prostate IMRT and androgen depravation therapy.  I will share my findings with Dr. Junious Silk and move forward with starting ADT now and scheduling placement of three gold fiducial markers into the prostate to proceed with IMRT in two months.  I enjoyed meeting with him today, and will look forward to participating in the care of this very nice gentleman.  I spent time face to face with the patient and more than 50% of that time was spent in counseling and/or coordination of care.     ------------------------------------------------  Sheral Apley. Tammi Klippel, M.D.  This document serves as a record of services personally performed by Tyler Pita, MD. It was created on his behalf by Darcus Austin, a trained medical scribe. The creation of this record is based on the scribe's personal observations and the provider's statements to them. This document has been checked and approved by the attending provider.

## 2015-08-03 NOTE — Progress Notes (Signed)
See progress note under physician encounter. 

## 2015-08-27 ENCOUNTER — Other Ambulatory Visit: Payer: Self-pay | Admitting: General Surgery

## 2016-08-12 ENCOUNTER — Other Ambulatory Visit: Payer: Self-pay | Admitting: Urology

## 2016-08-12 DIAGNOSIS — C61 Malignant neoplasm of prostate: Secondary | ICD-10-CM

## 2016-08-30 ENCOUNTER — Ambulatory Visit (HOSPITAL_COMMUNITY)
Admission: RE | Admit: 2016-08-30 | Discharge: 2016-08-30 | Disposition: A | Discharge status: Home / Self Care | Payer: Medicare Other | Source: Ambulatory Visit | Attending: Urology | Admitting: Urology

## 2016-08-30 DIAGNOSIS — C61 Malignant neoplasm of prostate: Secondary | ICD-10-CM | POA: Diagnosis not present

## 2016-08-30 LAB — POCT I-STAT CREATININE: CREATININE: 1.3 mg/dL — AB (ref 0.61–1.24)

## 2016-08-30 MED ORDER — GADOBENATE DIMEGLUMINE 529 MG/ML IV SOLN
20.0000 mL | Freq: Once | INTRAVENOUS | Status: AC | PRN
  Administered 2016-08-30: 18 mL via INTRAVENOUS

## 2016-11-10 ENCOUNTER — Emergency Department (HOSPITAL_COMMUNITY)
Admission: EM | Admit: 2016-11-10 | Discharge: 2016-11-10 | Disposition: A | Discharge status: Home / Self Care | Payer: Medicare Other | Attending: Emergency Medicine | Admitting: Emergency Medicine

## 2016-11-10 DIAGNOSIS — Z7984 Long term (current) use of oral hypoglycemic drugs: Secondary | ICD-10-CM | POA: Insufficient documentation

## 2016-11-10 DIAGNOSIS — B1710 Acute hepatitis C without hepatic coma: Secondary | ICD-10-CM | POA: Insufficient documentation

## 2016-11-10 DIAGNOSIS — Z7902 Long term (current) use of antithrombotics/antiplatelets: Secondary | ICD-10-CM | POA: Insufficient documentation

## 2016-11-10 DIAGNOSIS — I10 Essential (primary) hypertension: Secondary | ICD-10-CM | POA: Insufficient documentation

## 2016-11-10 DIAGNOSIS — R6884 Jaw pain: Secondary | ICD-10-CM | POA: Diagnosis not present

## 2016-11-10 DIAGNOSIS — Z8546 Personal history of malignant neoplasm of prostate: Secondary | ICD-10-CM | POA: Insufficient documentation

## 2016-11-10 DIAGNOSIS — Z79899 Other long term (current) drug therapy: Secondary | ICD-10-CM | POA: Insufficient documentation

## 2016-11-10 DIAGNOSIS — Z7982 Long term (current) use of aspirin: Secondary | ICD-10-CM | POA: Insufficient documentation

## 2016-11-10 DIAGNOSIS — E119 Type 2 diabetes mellitus without complications: Secondary | ICD-10-CM | POA: Insufficient documentation

## 2016-11-10 DIAGNOSIS — M79602 Pain in left arm: Secondary | ICD-10-CM | POA: Diagnosis not present

## 2016-11-10 MED ORDER — ACETAMINOPHEN 325 MG PO TABS
650.0000 mg | ORAL_TABLET | Freq: Once | ORAL | Status: AC
  Administered 2016-11-10: 650 mg via ORAL
  Filled 2016-11-10: qty 2

## 2016-11-10 NOTE — ED Notes (Signed)
Called with no response for triage
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
## 2016-11-10 NOTE — ED Notes (Signed)
Patient verbalized understanding of discharge instructions and denies any further needs or questions at this time. VS stable. Patient ambulatory with steady gait, escorted him and family to ED entrance.

## 2017-01-17 ENCOUNTER — Encounter: Payer: Self-pay | Admitting: Internal Medicine

## 2017-03-23 ENCOUNTER — Other Ambulatory Visit: Payer: Self-pay | Admitting: Urology

## 2017-03-23 DIAGNOSIS — C61 Malignant neoplasm of prostate: Secondary | ICD-10-CM

## 2017-04-07 ENCOUNTER — Encounter (HOSPITAL_COMMUNITY)
Admission: RE | Admit: 2017-04-07 | Discharge: 2017-04-07 | Disposition: A | Discharge status: Home / Self Care | Payer: Medicare Other | Source: Ambulatory Visit | Attending: Urology | Admitting: Urology

## 2017-04-07 DIAGNOSIS — C61 Malignant neoplasm of prostate: Secondary | ICD-10-CM | POA: Insufficient documentation

## 2017-04-07 MED ORDER — TECHNETIUM TC 99M MEDRONATE IV KIT
20.9000 | PACK | Freq: Once | INTRAVENOUS | Status: AC | PRN
  Administered 2017-04-07: 20.9 via INTRAVENOUS

## 2018-04-10 ENCOUNTER — Encounter: Payer: Self-pay | Admitting: Cardiology

## 2018-04-10 ENCOUNTER — Ambulatory Visit (INDEPENDENT_AMBULATORY_CARE_PROVIDER_SITE_OTHER): Payer: Medicaid Other | Admitting: Cardiology

## 2018-04-10 VITALS — BP 172/97 | HR 74 | Ht 66.0 in | Wt 189.6 lb

## 2018-04-10 DIAGNOSIS — I10 Essential (primary) hypertension: Secondary | ICD-10-CM | POA: Diagnosis not present

## 2018-04-10 DIAGNOSIS — E7800 Pure hypercholesterolemia, unspecified: Secondary | ICD-10-CM

## 2018-04-10 DIAGNOSIS — I4891 Unspecified atrial fibrillation: Secondary | ICD-10-CM

## 2018-04-10 DIAGNOSIS — Z0189 Encounter for other specified special examinations: Secondary | ICD-10-CM

## 2018-04-10 DIAGNOSIS — I259 Chronic ischemic heart disease, unspecified: Secondary | ICD-10-CM | POA: Insufficient documentation

## 2018-04-10 DIAGNOSIS — E1151 Type 2 diabetes mellitus with diabetic peripheral angiopathy without gangrene: Secondary | ICD-10-CM

## 2018-04-10 MED ORDER — LABETALOL HCL 100 MG PO TABS: 100.0000 mg | ORAL_TABLET | Freq: Two times a day (BID) | ORAL | 2 refills | Status: DC

## 2018-04-10 MED ORDER — RIVAROXABAN 20 MG PO TABS: 20.0000 mg | ORAL_TABLET | Freq: Every day | ORAL | 3 refills | Status: DC

## 2018-04-10 NOTE — Progress Notes (Signed)
Subjective:  Primary Physician:  Benito Mccreedy, MD  Patient ID: Dakota Scott, male    DOB: 01/04/1934, 85 y.o.   MRN: [id number]  Chief Complaint  Patient presents with  . Atrial Fibrillation    f/u   . Follow-up    HPI: Dakota Scott  is a 85 y.o. male  with Known peripheral arterial disease by lower extremity arterial duplex, I last seen him on 08/25/2016, hypertension, controlled diabetes mellitus, hyperlipidemia, referred to me to reestablish care due to new onset of atrial fibrillation. Over the past 2 years symptoms of claudication had stabilized since being on aspirin and Pletal.  Pletal was discontinued about 2 months ago. He still has mild symptoms of claudication. No recently given but patient thought that he was on 2 anticoagulants including aspirin.  No bleeding diathesis.  No GI bleed.  He takes iron supplements OTC.  He had gone to see his nephrologist for diabetic nephropathy,at that time he was found to have irregular heart rhythm, EKG obtained by PCP revealed atrial fibrillation on 04/05/2018.  No specific symptoms, no palpitations, no fatigue, no dyspnea.  Denies  chest pain.  Past Medical History:  Diagnosis Date  . Acute hepatitis C without mention of hepatic coma(070.51)   . Diabetes mellitus without complication ([place])   . Gout   . Hyperlipidemia   . Hypertension   . Prostate cancer [facility])     Past Surgical History:  Procedure Laterality Date  . EYE SURGERY  2009   cataract    Social History   Socioeconomic History  . Marital status: Married    Spouse name: Not on file  . Number of children: Not on file  . Years of education: Not on file  . Highest education level: Not on file  Occupational History  . Not on file  Social Needs  . Financial resource strain: Not on file  . Food insecurity:    Worry: Not on file    Inability: Not on file  . Transportation needs:    Medical: Not on file    Non-medical: Not on file  Tobacco Use  .  Smoking status: Never Smoker  . Smokeless tobacco: Never Used  Substance and Sexual Activity  . Alcohol use: Yes    Alcohol/week: 10.0 standard drinks    Types: 5 Glasses of wine, 5 Cans of beer per week  . Drug use: No  . Sexual activity: Not Currently  Lifestyle  . Physical activity:    Days per week: Not on file    Minutes per session: Not on file  . Stress: Not on file  Relationships  . Social connections:    Talks on phone: Not on file    Gets together: Not on file    Attends religious service: Not on file    Active member of club or organization: Not on file    Attends meetings of clubs or organizations: Not on file    Relationship status: Not on file  . Intimate partner violence:    Fear of current or ex partner: Not on file    Emotionally abused: Not on file    Physically abused: Not on file    Forced sexual activity: Not on file  Other Topics Concern  . 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 allopurinol (ZYLOPRIM) 100 MG tablet TAKE 1 TABLET BY MOUTH DAILY. 90 tablet 3  . amLODipine (NORVASC) 10 MG tablet TAKE 1 TABLET BY MOUTH DAILY. 90 tablet 3  . aspirin EC 81 MG tablet Take 81 mg by mouth daily.      . Cholecalciferol (D3-1000) 1000 UNITS capsule Take 1,000 Units by mouth daily.    . cilostazol (PLETAL) 50 MG tablet Take 50 mg by mouth 2 (two) times daily.    . colchicine 0.6 MG tablet At onset of gout flare take 2 tablets, may take 1 tablet 1 hr later if not relieved. May do this daily up to three days. 30 tablet 1  . ferrous sulfate 325 (65 FE) MG tablet Take 325 mg by mouth daily with breakfast.    . finasteride (PROPECIA) 1 MG tablet Take 1 mg by mouth daily.    Marland Kitchen  glimepiride (AMARYL) 4 MG tablet Take 4 mg by mouth daily with breakfast.    . losartan (COZAAR) 25 MG tablet Take 25 mg by mouth daily.    . metFORMIN (GLUMETZA) 1000 MG (MOD) 24 hr tablet Take 1,000 mg by mouth daily with breakfast.    . Multiple Vitamins-Minerals (CENTRUM SILVER PO) Take 1 tablet by mouth daily.    . pravastatin (PRAVACHOL) 20 MG tablet Take 40 mg by mouth daily.     . vitamin B-12 (CYANOCOBALAMIN) 1000 MCG tablet Take 1,000 mcg by mouth daily.     No current facility-administered medications on file prior to visit.      Review of Systems  Constitutional: Negative for malaise/fatigue and weight loss.  Respiratory: Negative for cough, hemoptysis and shortness of breath.   Cardiovascular: Positive for claudication and leg swelling. Negative for chest pain and palpitations.  Gastrointestinal: Negative for abdominal pain, blood in stool, constipation, heartburn and vomiting.  Genitourinary: Negative for dysuria.  Musculoskeletal: Positive for back pain. Negative for falls, joint pain and myalgias.  Neurological: Negative for dizziness, focal weakness and headaches.  Endo/Heme/Allergies: Does not bruise/bleed easily.  Psychiatric/Behavioral: Negative for depression. The patient is not nervous/anxious.   All other systems reviewed and are negative.      Objective:  Blood pressure (!) 172/97, pulse 74, height _0  (1.676 m), weight 189 lb 9.6 oz (86 kg), SpO2 97 %. Body mass index is 30.6 kg/m.   Physical Exam  Constitutional: He appears well-developed. No distress.  Mildly obese  HENT:  Head: Atraumatic.  Eyes: Conjunctivae are normal.  Neck: Neck supple. No JVD present. No thyromegaly present.  Cardiovascular: Normal rate, S1 normal, S2 normal and intact distal pulses. An irregularly irregular rhythm present. Exam reveals no gallop.  Murmur (2/6 midsystolic murmur at the apex) heard. Pulses:      Carotid pulses are 2+ on the right side and 2+ on the left  side.      Radial pulses are 2+ on the right side and 2+ on the left side.       Femoral pulses are 2+ on the right side and 2+ on the left side.      Popliteal pulses are 1+ on the right side and 1+ on the left side.       Dorsalis pedis pulses are 0 on the right side and 0 on the left side.  Posterior tibial pulses are 0 on the right side and 0 on the left side.  Skin is warm.  Capillary fill normal.  Pulmonary/Chest: Effort normal and breath sounds normal.  Abdominal: Soft. Bowel sounds are normal.  Musculoskeletal: Normal range of motion.        General: No edema.  Neurological: He is alert.  Skin: Skin is warm and dry.  Psychiatric: He has a normal mood and affect.    CARDIAC STUDIES:  Lower extremity arterial duplex 12/01/2015: No hemodynamically significant stenoses are identified in thelower extremity arterial system. This exam reveals moderately decreased perfusion of the lower extremity, with RABI 0.76 and LABI 0.66 noted at the post tibial artery level. There is diffuse plaque and abnormal waveform in vessels below knee suggests small vessel disease.  Treadmill exercise stress test 11/16/2015: Indications:HLD,HTN,Diabetes The resting electrocardiogram demonstrated normal sinus rhythm with an IRBBB, no resting arrhythmias and normal rest repolarization.  The stress electrocardiogram was normal.  There were no significant arrhythmias. Patient exercised on Bruce protocol for  4:00 minutes and achieved 100% of Max Predicted HR (Target HR was >85% MPHR) and 5.82 METS. Stress symptoms included fatigue and dyspnea. Normal BP response.  Exercise capacity was  low. Impression: Normal stress EKG.   Rare PVC during stress test and  V- bigemini brief 4 beats in recovery. Normal BP response. Low exercise tolerence.    Assessment & Recommendations:   1. New onset atrial fibrillation ([place]) EKG 04/10/2018: Atrial fibrillation with controlled ventricular response at the rate of 89 bpm,  left axis deviation, left anterior fascicular block. Cannot exclude inferior infarct old. Poor R wave progression, probably normal variant, cannot exclude anteroseptal infarct old. Nonspecific T abnormality. Single PVC. - EKG 12-Lead I have started him on Xarelto 20 mg after dinner, however his dose may need to be adjusted according to his EGFR.  Ischemic etiology for underlying new onset A. fib in a patient with diabetes with PAD and also probably stage III chronic kidney disease need to be excluded, Schedule for a Lexiscan Sestamibi stress test to evaluate for myocardial ischemia. Patient unable to do treadmill stress testing due to leg pain and gait instability.  2. Hypercholesteremia We will obtain lipid profile testing as recent testing has not been done.  3. Diabetes mellitus type 2 with peripheral artery disease (HCC) Uncontrolled diabetes, appears that he probably has stage III chronic kidney disease, there is a conflict of labs between PCP and Dr. Moshe Cipro, his nephrologist.  I will repeat BMP.  4. Essential hypertension Blood pressure is elevated today.  Heart rate is controlled, in view of his advanced age, although metoprolol was prescribed, patient has not picked it up.  I will switch it to labetalol 100 mg p.o. twice daily.  6.  Bilateral leg edema Suspect it is related to chronic venous stasis and also dependent edema due to patient sitting on the chair all the time.  Advised use of support stockings.  Will like to avoid diuretics for now.  5. Laboratory examination Labs 04/05/2018: EGFR 77 mL, Dr. Moshe Cipro.  Labs 02/02/16/2019: HbA1c 7.7%.  Serum glucose 130 mg, BUN 21, creatinine 1.41, potassium 3.9, EGFR 53 mL. TSH normal.  Labs 04/05/2018: Thyroid panel, normal.  Serum glucose 333 mg, BUN 28, creatinine 1.51, EGFR 49 mL.   Recommendation: He has stable symptoms of claudication, I have discontinued Pletal permanently, it was stopped 2 months ago.  I also  discontinued aspirin.  Continue Xarelto for now, dose need to be readjusted  as dictated above.  I will see him back after the above tests and make further recommendations.  As he is asymptomatic with regard to atrial fibrillation, depending on his stress test and echocardiogram, we can pursue rate control strategy only.  This was a 60-minute  encounter with at least 40 minutes face-to-face with the patient, addressing his medical issues, review of external records and coordination of care.  Dakota Prows, MD, [facility] 04/10/2018, 4:30 PM [facility] [place] Pager: [phone] Office: [phone] If no answer Cell [phone]

## 2018-04-16 ENCOUNTER — Ambulatory Visit: Payer: Medicaid Other

## 2018-04-16 DIAGNOSIS — I4891 Unspecified atrial fibrillation: Secondary | ICD-10-CM

## 2018-04-25 ENCOUNTER — Encounter: Payer: Self-pay | Admitting: Cardiology

## 2018-04-25 ENCOUNTER — Ambulatory Visit (INDEPENDENT_AMBULATORY_CARE_PROVIDER_SITE_OTHER): Payer: Medicaid Other | Admitting: Cardiology

## 2018-04-25 VITALS — BP 159/88 | HR 71 | Ht 66.0 in | Wt 189.6 lb

## 2018-04-25 DIAGNOSIS — Z0189 Encounter for other specified special examinations: Secondary | ICD-10-CM | POA: Diagnosis not present

## 2018-04-25 DIAGNOSIS — E1151 Type 2 diabetes mellitus with diabetic peripheral angiopathy without gangrene: Secondary | ICD-10-CM

## 2018-04-25 DIAGNOSIS — I10 Essential (primary) hypertension: Secondary | ICD-10-CM | POA: Diagnosis not present

## 2018-04-25 DIAGNOSIS — I4891 Unspecified atrial fibrillation: Secondary | ICD-10-CM

## 2018-04-25 DIAGNOSIS — I739 Peripheral vascular disease, unspecified: Secondary | ICD-10-CM

## 2018-04-25 HISTORY — DX: Peripheral vascular disease, unspecified: I73.9

## 2018-04-25 MED ORDER — LABETALOL HCL 200 MG PO TABS: 100.0000 mg | ORAL_TABLET | Freq: Two times a day (BID) | ORAL | 1 refills | Status: DC

## 2018-04-25 NOTE — Progress Notes (Signed)
Subjective:  Primary Physician:  Benito Mccreedy, MD  Patient ID: Dakota Scott, male    DOB: 1933/11/26, 85 y.o.   MRN: [id number]  Chief Complaint  Patient presents with  . Leg Swelling  . Fatigue  . Follow-up    HPI: Dakota Scott  is a 85 y.o. male  with Known peripheral arterial disease by lower extremity arterial duplex 08/25/2016, hypertension, controlled diabetes mellitus, hyperlipidemia, presents for f/u of new onset of atrial fibrillation.   He was started on Xarelto on his last OV 3 - 4 weeks ago.  No bleeding diathesis.  No GI bleed.  He takes iron supplements OTC.  He had gone to see his nephrologist for diabetic nephropathy,at that time he was found to have irregular heart rhythm, EKG obtained by PCP revealed atrial fibrillation on 04/05/2018.  No specific symptoms, no palpitations, no fatigue, no dyspnea.  Denies  chest pain.  Past Medical History:  Diagnosis Date  . Acute hepatitis C without mention of hepatic coma(070.51)   . Claudication, intermittent ([place]) 04/25/2018  . Diabetes mellitus without complication ([place])   . Gout   . Hyperlipidemia   . Hypertension   . Prostate cancer [facility])     Past Surgical History:  Procedure Laterality Date  . EYE SURGERY  2009   cataract    Social History   Socioeconomic History  . Marital status: Married    Spouse name: Not on file  . Number of children: 7  . Years of education: Not on file  . Highest education level: Not on file  Occupational History  . Not on file  Social Needs  . Financial resource strain: Not on file  . Food insecurity:    Worry: Not on file    Inability: Not on file  . Transportation needs:    Medical: Not on file    Non-medical: Not on file  Tobacco Use  . Smoking status: Never Smoker  . Smokeless tobacco: Never Used  Substance and Sexual Activity  . Alcohol use: Not Currently  . Drug use: No  . Sexual activity: Not Currently  Lifestyle  . Physical activity:    Days per  week: Not on file    Minutes per session: Not on file  . Stress: Not on file  Relationships  . Social connections:    Talks on phone: Not on file    Gets together: Not on file    Attends religious service: Not on file    Active member of club or organization: Not on file    Attends meetings of clubs or organizations: Not on file    Relationship status: Not on file  . Intimate partner violence:    Fear of current or ex partner: Not on file    Emotionally abused: Not on file    Physically abused: Not on file    Forced sexual activity: Not on file  Other Topics Concern  . 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 allopurinol (ZYLOPRIM) 100 MG tablet TAKE 1 TABLET BY MOUTH DAILY. 90 tablet 3  . amLODipine (NORVASC) 10 MG tablet TAKE 1 TABLET BY MOUTH  DAILY. 90 tablet 3  . Cholecalciferol (D3-1000) 1000 UNITS capsule Take 1,000 Units by mouth daily.    . colchicine 0.6 MG tablet At onset of gout flare take 2 tablets, may take 1 tablet 1 hr later if not relieved. May do this daily up to three days. 30 tablet 1  . ferrous sulfate 325 (65 FE) MG tablet Take 325 mg by mouth daily with breakfast.    . finasteride (PROPECIA) 1 MG tablet Take 1 mg by mouth daily.    Marland Kitchen glimepiride (AMARYL) 4 MG tablet Take 4 mg by mouth daily with breakfast.    . labetalol (NORMODYNE) 100 MG tablet Take 1 tablet (100 mg total) by mouth 2 (two) times daily. 60 tablet 2  . losartan (COZAAR) 25 MG tablet Take 25 mg by mouth daily.    . metFORMIN (GLUMETZA) 1000 MG (MOD) 24 hr tablet Take 1,000 mg by mouth daily with breakfast.    . Multiple Vitamins-Minerals (CENTRUM SILVER PO) Take 1 tablet by mouth daily.    . pravastatin  (PRAVACHOL) 20 MG tablet Take 40 mg by mouth daily.     . rivaroxaban (XARELTO) 20 MG TABS tablet Take 1 tablet (20 mg total) by mouth daily with supper. 30 tablet 3  . vitamin B-12 (CYANOCOBALAMIN) 1000 MCG tablet Take 1,000 mcg by mouth daily.     No current facility-administered medications on file prior to visit.      Review of Systems  Constitutional: Negative for malaise/fatigue and weight loss.  Respiratory: Negative for cough, hemoptysis and shortness of breath.   Cardiovascular: Positive for claudication and leg swelling. Negative for chest pain and palpitations.  Gastrointestinal: Negative for abdominal pain, blood in stool, constipation, heartburn and vomiting.  Genitourinary: Negative for dysuria.  Musculoskeletal: Positive for back pain. Negative for falls, joint pain and myalgias.  Neurological: Negative for dizziness, focal weakness and headaches.  Endo/Heme/Allergies: Does not bruise/bleed easily.  Psychiatric/Behavioral: Negative for depression. The patient is not nervous/anxious.   All other systems reviewed and are negative.      Objective:  Blood pressure (!) 159/88, pulse 71, height '5\' 6"'  (1.676 m), weight 189 lb 9.6 oz (86 kg). Body mass index is 30.6 kg/m.   Physical Exam  Constitutional: He appears well-developed. No distress.  Mildly obese  HENT:  Head: Atraumatic.  Eyes: Conjunctivae are normal.  Neck: Neck supple. No JVD present. No thyromegaly present.  Cardiovascular: Normal rate, S1 normal, S2 normal and intact distal pulses. An irregularly irregular rhythm present. Exam reveals no gallop.  Murmur (2/6 midsystolic murmur at the apex) heard. Pulses:      Carotid pulses are 2+ on the right side and 2+ on the left side.      Radial pulses are 2+ on the right side and 2+ on the left side.       Femoral pulses are 2+ on the right side and 2+ on the left side.      Popliteal pulses are 1+ on the right side and 1+ on the left side.       Dorsalis pedis  pulses are 0 on the right side and 0 on the left side.       Posterior tibial pulses are 0 on the right side and 0 on the left side.  Skin is warm.  Capillary fill normal.  Pulmonary/Chest: Effort normal and breath sounds normal.  Abdominal: Soft. Bowel sounds are normal.  Musculoskeletal: Normal range of motion.  General: No edema.  Neurological: He is alert.  Skin: Skin is warm and dry.  Psychiatric: He has a normal mood and affect.    CARDIAC STUDIES:  Lexiscan Sestamibi stress test 04/16/2018: 1. Lexiscan stress test was performed. Exercise capacity was not assessed. No stress symptoms reported. Resting blood pressure was 150/110 mmHg and peak effect blood pressure was 110/78 mmHg. The resting and stress electrocardiogram demonstrated atrial fibrillation, normal resting conduction, low voltage, and normal rest repolarization. Stress EKG is non diagnostic for ischemia as it is a pharmacologic stress. 2. The overall quality of the study is good. There is no evidence of abnormal lung activity. Stress and rest SPECT images demonstrate homogeneous tracer distribution throughout the myocardium. Gated SPECT imaging reveals normal myocardial thickening and wall motion. The left ventricular ejection fraction was normal (57%).  3. Low risk study.  Outside Echocardiogram 09/06/2013: Normal LV systolic function, mild LVH, grade 1 diastolic dysfunction.  Lower extremity arterial duplex 12/01/2015: No hemodynamically significant stenoses are identified in thelower extremity arterial system. This exam reveals moderately decreased perfusion of the lower extremity, with RABI 0.76 and LABI 0.66 noted at the post tibial artery level. There is diffuse plaque and abnormal waveform in vessels below knee suggests small vessel disease.   Assessment & Recommendations:   1. New onset atrial fibrillation ([place]) EKG 04/10/2018: Atrial fibrillation with controlled ventricular response at the rate of 89 bpm,  left axis deviation, left anterior fascicular block. Cannot exclude inferior infarct old. Poor R wave progression, probably normal variant, cannot exclude anteroseptal infarct old. Nonspecific T abnormality. Single PVC.  2. Hypercholesteremia  3. Diabetes mellitus type 2 with peripheral artery disease ([place] )  4. Essential hypertension  6.  Bilateral leg edema Suspect it is related to chronic venous stasis and also dependent edema  5. Laboratory examination Labs 04/05/2018: EGFR 77 mL, Dr. Moshe Cipro.  Labs 02/02/16/2019: HbA1c 7.7%.  Serum glucose 130 mg, BUN 21, creatinine 1.41, potassium 3.9, EGFR 53 mL. TSH normal.  Labs 04/05/2018: Thyroid panel, normal.  Serum glucose 333 mg, BUN 28, creatinine 1.51, EGFR 49 mL.   Recommendation:   I have reviewed the results of the recently performed nuclear stress test, fortunately nonischemic and also his LVEF by nuclear stress test is normal.  Hence I did not order echocardiogram.  As he is asymptomatic from atrial fibrillation, we should continue and pursue rate control strategy only.  In view of his advanced age, rate controlled atrial fibrillation, instead of metoprolol I have added labetalol so as not to have significant bradycardia.  Blood pressure is elevated today, we will increase labetalol from 100 mg to 200 mg b.i.d.  I'd like to see him back in 3 months.  I had ordered BMP and lipid profile testing on his last office visit as there was discrepancy between eGFR between nephrology and his PCP, to adjust the dosage of Xarelto, I need repeat labs.  I discussed this with the patient's daughter at the bedside.  They also understand the importance of taking anticoagulation.  Risk of bleeding again discussed with them.  Adrian Prows, MD, [facility] 04/25/2018, [place] [facility]. [place] Pager: [phone] Office: [phone] If no answer Cell [phone]

## 2018-04-27 ENCOUNTER — Telehealth: Payer: Self-pay | Admitting: Cardiology

## 2018-04-27 DIAGNOSIS — I4821 Permanent atrial fibrillation: Secondary | ICD-10-CM

## 2018-04-27 DIAGNOSIS — I4891 Unspecified atrial fibrillation: Secondary | ICD-10-CM

## 2018-04-27 LAB — LIPID PANEL WITH LDL/HDL RATIO
CHOLESTEROL TOTAL: 159 mg/dL (ref 100–199)
HDL: 30 mg/dL — ABNORMAL LOW (ref >39)
LDL CALC: 100 mg/dL — AB (ref 0–99)
LDl/HDL Ratio: 3.3 ratio (ref 0.0–3.6)
Triglycerides: 143 mg/dL (ref 0–149)
VLDL Cholesterol Cal: 29 mg/dL (ref 5–40)

## 2018-04-27 LAB — BASIC METABOLIC PANEL
BUN / CREAT RATIO: 18 (ref 10–24)
BUN: 24 mg/dL (ref 8–27)
CO2: 28 mmol/L (ref 20–29)
CREATININE: 1.35 mg/dL — AB (ref 0.76–1.27)
Calcium: 9.4 mg/dL (ref 8.6–10.2)
Chloride: 98 mmol/L (ref 96–106)
GFR calc Af Amer: 56 mL/min/{1.73_m2} — ABNORMAL LOW (ref >59)
GFR calc non Af Amer: 49 mL/min/{1.73_m2} — ABNORMAL LOW (ref >59)
GLUCOSE: 204 mg/dL — AB (ref 65–99)
Potassium: 3.9 mmol/L (ref 3.5–5.2)
SODIUM: 140 mmol/L (ref 134–144)

## 2018-04-27 MED ORDER — RIVAROXABAN 15 MG PO TABS: 15.0000 mg | ORAL_TABLET | Freq: Every day | ORAL | 6 refills | Status: DC

## 2018-04-27 NOTE — Progress Notes (Signed)
LEFT MESSAGE FOR PT TO CALL BACK.

## 2018-04-27 NOTE — Telephone Encounter (Signed)
Labs reviewed.  He has mild chronic renal insufficiency/chronic kidney disease.  Lipids are normal, blood sugar is elevated, patient is a known diabetic.  Xarelto that was prescribed as 20 mg should be reduced to 15 mg daily, finish off the prescription that he presently has and start with 15 mg daily.  New prescription will be sent.  Please advised patient to bring labs every time PCP does labs to my office visit.

## 2018-04-27 NOTE — Telephone Encounter (Signed)
LEFT MESSAGE FOR PT TO CALL BACK.

## 2018-08-03 ENCOUNTER — Telehealth: Payer: Self-pay

## 2018-08-21 NOTE — Telephone Encounter (Signed)
appt scheduled for 09/07/2018 @ 3:30pm

## 2018-08-22 ENCOUNTER — Ambulatory Visit: Payer: Medicare Other | Admitting: Cardiology

## 2018-09-07 ENCOUNTER — Encounter: Payer: Self-pay | Admitting: Cardiology

## 2018-09-07 ENCOUNTER — Ambulatory Visit (INDEPENDENT_AMBULATORY_CARE_PROVIDER_SITE_OTHER): Payer: Medicaid Other | Admitting: Cardiology

## 2018-09-07 ENCOUNTER — Other Ambulatory Visit: Payer: Self-pay

## 2018-09-07 VITALS — BP 150/82 | HR 73 | Ht 66.0 in | Wt 192.0 lb

## 2018-09-07 DIAGNOSIS — E1151 Type 2 diabetes mellitus with diabetic peripheral angiopathy without gangrene: Secondary | ICD-10-CM

## 2018-09-07 DIAGNOSIS — I4821 Permanent atrial fibrillation: Secondary | ICD-10-CM

## 2018-09-07 DIAGNOSIS — I10 Essential (primary) hypertension: Secondary | ICD-10-CM | POA: Diagnosis not present

## 2018-09-07 DIAGNOSIS — I4891 Unspecified atrial fibrillation: Secondary | ICD-10-CM

## 2018-09-07 DIAGNOSIS — R600 Localized edema: Secondary | ICD-10-CM

## 2018-09-07 DIAGNOSIS — R350 Frequency of micturition: Secondary | ICD-10-CM

## 2018-09-07 HISTORY — DX: Unspecified atrial fibrillation: I48.91

## 2018-09-07 MED ORDER — FUROSEMIDE 20 MG PO TABS: 40.0000 mg | ORAL_TABLET | Freq: Every day | ORAL | 1 refills | Status: DC

## 2018-09-07 MED ORDER — DOXAZOSIN MESYLATE 2 MG PO TABS: 4.0000 mg | ORAL_TABLET | Freq: Every day | ORAL | 0 refills | Status: DC

## 2018-09-07 MED ORDER — DOXAZOSIN MESYLATE 2 MG PO TABS: 2.0000 mg | ORAL_TABLET | Freq: Every day | ORAL | 0 refills | Status: DC

## 2018-09-07 NOTE — Progress Notes (Signed)
Primary Physician/Referring:  Benito Mccreedy, MD  Patient ID: Dakota Scott, male    DOB: 1933/10/10, 85 y.o.   MRN: [id number]  Chief Complaint  Patient presents with  . Atrial Fibrillation  . Hypertension  . Follow-up   HPI:    HPI: Dakota Scott  is a 85 y.o. male  peripheral arterial disease by lower extremity arterial duplex 08/25/2016, hypertension, controlled diabetes mellitus with stage 3 CKD, hyperlipidemia, presents for f/u of atrial fibrillation. He had gone to see his nephrologist for diabetic nephropathy,at that time he was found to have irregular heart rhythm, EKG obtained by PCP revealed atrial fibrillation on 04/05/2018.  No specific symptoms, no palpitations, no fatigue, no dyspnea.  Denies  chest pain.  No bleeding diathesis on Xarelto.  No GI bleed.  He takes iron supplements OTC.  Past Medical History:  Diagnosis Date  . Acute hepatitis C without mention of hepatic coma(070.51)   . Atrial fibrillation ([place]) 09/07/2018  . Claudication, intermittent ([place]) 04/25/2018  . Diabetes mellitus without complication ([place])   . Gout   . Hyperlipidemia   . Hypertension   . Prostate cancer [facility])    Past Surgical History:  Procedure Laterality Date  . EYE SURGERY  2009   cataract   Social History   Socioeconomic History  . Marital status: Married    Spouse name: Not on file  . Number of children: 7  . Years of education: Not on file  . Highest education level: Not on file  Occupational History  . Not on file  Social Needs  . Financial resource strain: Not on file  . Food insecurity    Worry: Not on file    Inability: Not on file  . Transportation needs    Medical: Not on file    Non-medical: Not on file  Tobacco Use  . Smoking status: Never Smoker  . Smokeless tobacco: Never Used  Substance and Sexual Activity  . Alcohol use: Not Currently  . Drug use: No  . Sexual activity: Not Currently  Lifestyle  . Physical activity    Days per week: Not on  file    Minutes per session: Not on file  . Stress: Not on file  Relationships  . Social Herbalist on phone: Not on file    Gets together: Not on file    Attends religious service: Not on file    Active member of club or organization: Not on file    Attends meetings of clubs or organizations: Not on file    Relationship status: Not on file  . Intimate partner violence    Fear of current or ex partner: Not on file    Emotionally abused: Not on file    Physically abused: Not on file    Forced sexual activity: Not on file  Other Topics Concern  . 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  Cardiovascular: Positive for claudication and leg swelling. Negative for dyspnea on exertion and syncope.  Endocrine: Negative for cold intolerance.  Hematologic/Lymphatic: Does not bruise/bleed easily.  Musculoskeletal: Positive for back pain and joint pain. Negative for  joint swelling.  Gastrointestinal: Negative for abdominal pain, anorexia, change in bowel habit, hematochezia and melena.  Genitourinary: Positive for frequency.  Neurological: Positive for paresthesias (feet). Negative for headaches and light-headedness.  Psychiatric/Behavioral: Negative for depression and substance abuse.  All other systems reviewed and are negative.  Objective  Blood pressure (!) 150/82, pulse 73, height 5\' 6"  (1.676 m), weight 192 lb (87.1 kg), SpO2 94 %. Body mass index is 30.99 kg/m.   Physical Exam  Constitutional: He appears well-developed. No distress.  Mildly obese  HENT:  Head: Atraumatic.  Eyes: Conjunctivae are normal.  Neck: Neck supple. No JVD present. No thyromegaly present.   Cardiovascular: Normal rate, S1 normal, S2 normal and intact distal pulses. An irregularly irregular rhythm present. Exam reveals no gallop.  Murmur (2/6 midsystolic murmur at the apex) heard. Pulses:      Carotid pulses are 2+ on the right side and 2+ on the left side.      Radial pulses are 2+ on the right side and 2+ on the left side.       Femoral pulses are 2+ on the right side and 2+ on the left side.      Popliteal pulses are 1+ on the right side and 1+ on the left side.       Dorsalis pedis pulses are 0 on the right side and 0 on the left side.       Posterior tibial pulses are 0 on the right side and 0 on the left side.  Skin is warm.  Capillary fill normal.  Pulmonary/Chest: Effort normal and breath sounds normal.  Abdominal: Soft. Bowel sounds are normal.  Musculoskeletal: Normal range of motion.        General: No edema.  Neurological: He is alert.  Skin: Skin is warm and dry.  Psychiatric: He has a normal mood and affect.   Radiology: No results found.  Laboratory examination:    CMP Latest Ref Rng & Units 04/26/2018 08/30/2016 10/08/2013  Glucose 65 - 99 mg/dL 204(H) - 113(H)  BUN 8 - 27 mg/dL 24 - 21  Creatinine 0.76 - 1.27 mg/dL 1.35(H) 1.30(H) 1.23  Sodium 134 - 144 mmol/L 140 - 139  Potassium 3.5 - 5.2 mmol/L 3.9 - 4.0  Chloride 96 - 106 mmol/L 98 - 96(L)  CO2 20 - 29 mmol/L 28 - 26  Calcium 8.6 - 10.2 mg/dL 9.4 - 9.9  Total Protein 6.0 - 8.5 g/dL - - 7.8  Total Bilirubin 0.0 - 1.2 mg/dL - - 0.6  Alkaline Phos 39 - 117 IU/L - - 79  AST 0 - 40 IU/L - - 26  ALT 0 - 44 IU/L - - 23   CBC Latest Ref Rng & Units 02/10/2011 01/29/2011 05/02/2008  WBC 4.5 - 10.5 K/uL 4.7 - 5.1  Hemoglobin 13.0 - 17.0 g/dL 14.9 16.0 16.0  Hematocrit 39.0 - 52.0 % 43.5 47.0 45.9  Platelets 150.0 - 400.0 K/uL 207.0 - 195   Lipid Panel     Component Value Date/Time   CHOL 159 04/26/2018 0956   TRIG 143 04/26/2018 0956   HDL 30 (L) 04/26/2018 0956   CHOLHDL 5 02/10/2011 0939   VLDL  19.8 02/10/2011 0939   LDLCALC 100 (H) 04/26/2018 0956   HEMOGLOBIN A1C Lab Results  Component Value Date   HGBA1C 7.1 (H) 02/10/2011   TSH No results for input(s): TSH in the last 8760 hours. Medications   Current Outpatient Medications  Medication Instructions  . allopurinol (  ZYLOPRIM) 100 MG tablet TAKE 1 TABLET BY MOUTH DAILY.  Marland Kitchen amLODipine (NORVASC) 10 MG tablet TAKE 1 TABLET BY MOUTH DAILY.  Marland Kitchen Cholecalciferol (D3-1000) 1,000 Units, Daily  . colchicine 0.6 MG tablet At onset of gout flare take 2 tablets, may take 1 tablet 1 hr later if not relieved. May do this daily up to three days.  Marland Kitchen doxazosin (CARDURA) 2 mg, Oral, Daily at bedtime  . finasteride (PROPECIA) 5 mg, Oral, Daily  . furosemide (LASIX) 40 mg, Oral, Daily  . gabapentin (NEURONTIN) 300 mg, Oral, 3 times daily, PRN   . glimepiride (AMARYL) 4 mg, Daily with breakfast  . labetalol (NORMODYNE) 100 mg, Oral, 2 times daily  . losartan (COZAAR) 50 mg, Oral, Daily  . metFORMIN (GLUMETZA) 500 mg, Oral, 2 times daily  . Multiple Vitamins-Minerals (CENTRUM SILVER PO) 1 tablet, Daily  . pravastatin (PRAVACHOL) 20 mg, Oral, Daily  . Rivaroxaban (XARELTO) 15 mg, Oral, Daily with supper    Cardiac Studies:   Lexiscan Sestamibi stress test 04/16/2018: 1. Lexiscan stress test was performed. Exercise capacity was not assessed. No stress symptoms reported. Resting blood pressure was 150/110 mmHg and peak effect blood pressure was 110/78 mmHg. The resting and stress electrocardiogram demonstrated atrial fibrillation, normal resting conduction, low voltage, and normal rest repolarization. Stress EKG is non diagnostic for ischemia as it is a pharmacologic stress. 2. The overall quality of the study is good. There is no evidence of abnormal lung activity. Stress and rest SPECT images demonstrate homogeneous tracer distribution throughout the myocardium. Gated SPECT imaging reveals normal myocardial thickening and wall motion. The left  ventricular ejection fraction was normal (57%).  3. Low risk study.  Outside Echocardiogram 09/06/2013: Normal LV systolic function, mild LVH, grade 1 diastolic dysfunction.  Lower extremity arterial duplex 12/01/2015: No hemodynamically significant stenoses are identified in thelower extremity arterial system. This exam reveals moderately decreased perfusion of the lower extremity, with RABI 0.76 and LABI 0.66 noted at the post tibial artery level. There is diffuse plaque and abnormal waveform in vessels below knee suggests small vessel disease.  Assessment     ICD-10-CM   1. Essential hypertension  I10 furosemide (LASIX) 20 MG tablet  2. Permanent atrial fibrillation  I48.21 EKG 12-Lead   CHA2DS2-VASc Score is 4.  Yearly risk of stroke: 4%.   3. Diabetes mellitus type 2 with peripheral artery disease ([place])  E11.51   4. Leg edema  R60.0 furosemide (LASIX) 20 MG tablet  5. Frequency of urination  R35.0 doxazosin (CARDURA) 2 MG tablet    DISCONTINUED: doxazosin (CARDURA) 2 MG tablet    EKG 09/07/2018: Atrial fibrillation with controlled ventricular response at the rate of 81 bpm, left axis deviation, left anterior fascicular block.  Incomplete right bundle branch block.  Nonspecific T abnormality. No significant change from  EKG 04/10/2018   Recommendations:   Patient with permanent atrial fibrillation presents for f/u of A. Fib and hypertension. Metoprolol discontinued on his prior visit due to low HR and switched to Labetalol. BP is still elevated, he also has frequency of urination getting up every 2-3 hours at night and hence will try Doxazocin 2 mg at night and he will f/u with his PCP for refill request.   He does have diabetic stage 3 CKD and also PAD due to small vessel disease as his popliteal and femoral vessels are well felt. He probably has peripheral neuropathy as well. For leg edema and hypertension, will also increase furosemide to 40 mg daily  from 20 mg as he does not see any  response to 20 mg. He will continue to follow nephrology and discuss the dosing with them.  I will see him back in 6 months.   Adrian Prows, MD, [facility] 09/09/2018, 7:00 AM [facility]. [place] Pager: [phone] Office: [phone] If no answer Cell [phone]

## 2018-09-09 ENCOUNTER — Encounter: Payer: Self-pay | Admitting: Cardiology

## 2018-11-01 ENCOUNTER — Other Ambulatory Visit: Payer: Self-pay | Admitting: Cardiology

## 2018-11-01 DIAGNOSIS — I10 Essential (primary) hypertension: Secondary | ICD-10-CM

## 2018-11-01 DIAGNOSIS — I4821 Permanent atrial fibrillation: Secondary | ICD-10-CM

## 2018-11-01 DIAGNOSIS — I4891 Unspecified atrial fibrillation: Secondary | ICD-10-CM

## 2018-11-05 ENCOUNTER — Encounter (HOSPITAL_COMMUNITY): Payer: Self-pay | Admitting: Pharmacy Technician

## 2018-11-05 ENCOUNTER — Other Ambulatory Visit: Payer: Self-pay

## 2018-11-05 ENCOUNTER — Emergency Department (HOSPITAL_COMMUNITY)
Admission: EM | Admit: 2018-11-05 | Discharge: 2018-11-06 | Disposition: A | Discharge status: Home / Self Care | Payer: Medicare Other | Attending: Emergency Medicine | Admitting: Emergency Medicine

## 2018-11-05 DIAGNOSIS — Z79899 Other long term (current) drug therapy: Secondary | ICD-10-CM | POA: Diagnosis not present

## 2018-11-05 DIAGNOSIS — I10 Essential (primary) hypertension: Secondary | ICD-10-CM | POA: Diagnosis not present

## 2018-11-05 DIAGNOSIS — K529 Noninfective gastroenteritis and colitis, unspecified: Secondary | ICD-10-CM | POA: Diagnosis not present

## 2018-11-05 DIAGNOSIS — I4891 Unspecified atrial fibrillation: Secondary | ICD-10-CM | POA: Diagnosis not present

## 2018-11-05 DIAGNOSIS — Z8546 Personal history of malignant neoplasm of prostate: Secondary | ICD-10-CM | POA: Insufficient documentation

## 2018-11-05 DIAGNOSIS — E119 Type 2 diabetes mellitus without complications: Secondary | ICD-10-CM | POA: Diagnosis not present

## 2018-11-05 DIAGNOSIS — N402 Nodular prostate without lower urinary tract symptoms: Secondary | ICD-10-CM

## 2018-11-05 DIAGNOSIS — N281 Cyst of kidney, acquired: Secondary | ICD-10-CM

## 2018-11-05 DIAGNOSIS — R1013 Epigastric pain: Secondary | ICD-10-CM | POA: Diagnosis present

## 2018-11-05 MED ORDER — SODIUM CHLORIDE 0.9% FLUSH: 3.0000 mL | Freq: Once | INTRAVENOUS | Status: DC

## 2018-11-05 NOTE — ED Triage Notes (Signed)
Pt with near syncope yesterday, NV X1 week.  Hx diabetes.  162/92 HR 72 98% RA Alert and oriented X4

## 2018-11-05 NOTE — ED Triage Notes (Signed)
Pt also with epigastric pain

## 2018-11-06 ENCOUNTER — Emergency Department (HOSPITAL_COMMUNITY): Payer: Medicare Other

## 2018-11-06 DIAGNOSIS — K529 Noninfective gastroenteritis and colitis, unspecified: Secondary | ICD-10-CM | POA: Diagnosis not present

## 2018-11-06 LAB — TROPONIN I (HIGH SENSITIVITY): Troponin I (High Sensitivity): 9 ng/L (ref <18)

## 2018-11-06 LAB — CBC
HCT: 50 % (ref 39.0–52.0)
Hemoglobin: 17.7 g/dL — ABNORMAL HIGH (ref 13.0–17.0)
MCH: 32.5 pg (ref 26.0–34.0)
MCHC: 35.4 g/dL (ref 30.0–36.0)
MCV: 91.9 fL (ref 80.0–100.0)
Platelets: 214 10*3/uL (ref 150–400)
RBC: 5.44 MIL/uL (ref 4.22–5.81)
RDW: 11.9 % (ref 11.5–15.5)
WBC: 9.2 10*3/uL (ref 4.0–10.5)
nRBC: 0 % (ref 0.0–0.2)

## 2018-11-06 LAB — URINALYSIS, ROUTINE W REFLEX MICROSCOPIC
Bacteria, UA: NONE SEEN
Bilirubin Urine: NEGATIVE
Glucose, UA: NEGATIVE mg/dL
Hgb urine dipstick: NEGATIVE
Ketones, ur: NEGATIVE mg/dL
Leukocytes,Ua: NEGATIVE
Nitrite: NEGATIVE
Protein, ur: 100 mg/dL — AB
Specific Gravity, Urine: 1.02 (ref 1.005–1.030)
pH: 6 (ref 5.0–8.0)

## 2018-11-06 LAB — COMPREHENSIVE METABOLIC PANEL
ALT: 21 U/L (ref 0–44)
AST: 27 U/L (ref 15–41)
Albumin: 4 g/dL (ref 3.5–5.0)
Alkaline Phosphatase: 85 U/L (ref 38–126)
Anion gap: 15 (ref 5–15)
BUN: 22 mg/dL (ref 8–23)
CO2: 25 mmol/L (ref 22–32)
Calcium: 9.5 mg/dL (ref 8.9–10.3)
Chloride: 97 mmol/L — ABNORMAL LOW (ref 98–111)
Creatinine, Ser: 1.37 mg/dL — ABNORMAL HIGH (ref 0.61–1.24)
GFR calc Af Amer: 55 mL/min — ABNORMAL LOW (ref >60)
GFR calc non Af Amer: 48 mL/min — ABNORMAL LOW (ref >60)
Glucose, Bld: 167 mg/dL — ABNORMAL HIGH (ref 70–99)
Potassium: 4 mmol/L (ref 3.5–5.1)
Sodium: 137 mmol/L (ref 135–145)
Total Bilirubin: 1.3 mg/dL — ABNORMAL HIGH (ref 0.3–1.2)
Total Protein: 7.9 g/dL (ref 6.5–8.1)

## 2018-11-06 LAB — CBG MONITORING, ED: Glucose-Capillary: 156 mg/dL — ABNORMAL HIGH (ref 70–99)

## 2018-11-06 LAB — LIPASE, BLOOD: Lipase: 24 U/L (ref 11–51)

## 2018-11-06 MED ORDER — IOHEXOL 300 MG/ML  SOLN: 80.0000 mL | Freq: Once | INTRAMUSCULAR | Status: DC

## 2018-11-06 MED ORDER — SODIUM CHLORIDE 0.9% FLUSH: 3.0000 mL | Freq: Once | INTRAVENOUS | Status: DC

## 2018-11-06 MED ORDER — SODIUM CHLORIDE 0.9 % IV BOLUS
1000.0000 mL | Freq: Once | INTRAVENOUS | Status: AC
  Administered 2018-11-06: 11:00:00 1000 mL via INTRAVENOUS

## 2018-11-06 MED ORDER — ONDANSETRON HCL 4 MG/2ML IJ SOLN
4.0000 mg | Freq: Once | INTRAMUSCULAR | Status: AC
  Administered 2018-11-06: 4 mg via INTRAVENOUS
  Filled 2018-11-06: qty 2

## 2018-11-06 MED ORDER — IOHEXOL 300 MG/ML  SOLN
100.0000 mL | Freq: Once | INTRAMUSCULAR | Status: AC | PRN
  Administered 2018-11-06: 12:00:00 100 mL via INTRAVENOUS

## 2018-11-06 MED ORDER — ONDANSETRON 4 MG PO TBDP: 4.0000 mg | ORAL_TABLET | Freq: Three times a day (TID) | ORAL | 0 refills | Status: DC | PRN

## 2018-11-06 NOTE — ED Notes (Signed)
Patient transported to CT 

## 2018-11-06 NOTE — Discharge Instructions (Addendum)
Stay hydrated. You have some inflammation in your bowels likely from stomach virus   Take zofran for nausea   You have prostate nodule and renal cyst. Call your urologist for appointment   Follow up with GI for colonoscopy   Return to ER if you have worse abdominal pain, vomiting, fever

## 2018-11-06 NOTE — ED Provider Notes (Signed)
[facility] EMERGENCY DEPARTMENT Provider Note   CSN: [id number] Arrival date & time: 11/05/18  2303     History   Chief Complaint No chief complaint on file.   HPI Dakota Scott is a 85 y.o. male hx of afib on xarelto, DM, HL, HTN, here with epigastric pain, near syncope, dehydration.  Patient has been having poor appetite and epigastric pain for the last 2 to 3 days.  He tried some Compazine with minimal relief.  Patient felt lightheaded and dizzy yesterday as well .  Denies actual syncope or chest pain or shortness of breath.     The history is provided by the patient.    Past Medical History:  Diagnosis Date  . Acute hepatitis C without mention of hepatic coma(070.51)   . Atrial fibrillation ([place]) 09/07/2018  . Claudication, intermittent ([place]) 04/25/2018  . Diabetes mellitus without complication ([place])   . Gout   . Hyperlipidemia   . Hypertension   . Prostate cancer [facility])     Patient Active Problem List   Diagnosis Date Noted  . Atrial fibrillation ([place]) 09/07/2018  . Claudication, intermittent ([place]) 04/25/2018  . Hypercholesteremia 04/10/2018  . Ischemic heart disease 04/10/2018  . Action tremor 10/08/2013  . Malignant neoplasm of prostate ([place]) 02/17/2011  . Laboratory examination 02/17/2011  . 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 Controlled diabetes mellitus ([place]) 05/06/2008  . PREMATURE VENTRICULAR CONTRACTIONS 08/08/2007  . INTERMITTENT CLAUDICATION, BILATERAL 08/08/2007  . HEPATITIS C 01/02/2007  . ONYCHOMYCOSIS 01/02/2007  . GOUT 01/02/2007  . Essential hypertension 01/02/2007    Past Surgical History:  Procedure Laterality Date  . EYE SURGERY  2009   cataract        Home Medications    Prior to Admission medications   Medication Sig Start Date End Date Taking? Authorizing Provider  allopurinol (ZYLOPRIM) 100 MG tablet TAKE 1 TABLET BY MOUTH DAILY. 03/26/12   Norins, Heinz Knuckles, MD  amLODipine (NORVASC) 10 MG tablet  TAKE 1 TABLET BY MOUTH DAILY. Patient taking differently: 5 mg daily.  03/27/12   Norins, Heinz Knuckles, MD  Cholecalciferol (D3-1000) 1000 UNITS capsule Take 1,000 Units by mouth daily.    [provider]  colchicine 0.6 MG tablet At onset of gout flare take 2 tablets, may take 1 tablet 1 hr later if not relieved. May do this daily up to three days. 01/31/11   Norins, Heinz Knuckles, MD  doxazosin (CARDURA) 2 MG tablet Take 1 tablet (2 mg total) by mouth at bedtime. 09/07/18   Adrian Prows, MD  finasteride (PROPECIA) 1 MG tablet Take 5 mg by mouth daily.     [provider]  furosemide (LASIX) 20 MG tablet Take 2 tablets (40 mg total) by mouth daily. 09/07/18   Adrian Prows, MD  gabapentin (NEURONTIN) 300 MG capsule Take 300 mg by mouth 3 (three) times daily. PRN    [provider]  glimepiride (AMARYL) 4 MG tablet Take 4 mg by mouth daily with breakfast.    [provider]  labetalol (NORMODYNE) 100 MG tablet TAKE 1 TABLET (100MG ) BY MOUTH TWICE DAILY 11/01/18   Adrian Prows, MD  losartan (COZAAR) 50 MG tablet TAKE 1 TABLET BY MOUTH ONCE EVERY DAY 11/01/18   Adrian Prows, MD  metFORMIN (GLUMETZA) 500 MG (MOD) 24 hr tablet Take 500 mg by mouth 2 (two) times a day.     [provider]  Multiple Vitamins-Minerals (CENTRUM SILVER PO) Take 1 tablet by mouth daily.  [provider]  pravastatin (PRAVACHOL) 20 MG tablet Take 20 mg by mouth daily.  06/02/15   [provider]  XARELTO 15 MG TABS tablet TAKE 1 TABLET BY MOUTH ONCE EVERY DAY WITH SUPPER 11/01/18   Adrian Prows, MD    Family History Family History  Problem Relation Age of Onset  . Hyperlipidemia Neg Hx   . Heart disease Neg Hx   . Diabetes Neg Hx   . Cancer Neg Hx        colon, lung, and prostate cancer    Social History Social History   Tobacco Use  . Smoking status: Never Smoker  . Smokeless tobacco: Never Used  Substance Use Topics  . Alcohol use: Not Currently  . Drug use: No      Allergies   Patient has no active allergies.   Review of Systems Review of Systems  Gastrointestinal: Positive for abdominal pain and nausea.  All other systems reviewed and are negative.    Physical Exam Updated Vital Signs BP (!) 145/74 (BP Location: Right Arm)   Pulse 87   Temp 97.6 F (36.4 C) (Oral)   Resp 18   SpO2 100%   Physical Exam Vitals signs and nursing note reviewed.  HENT:     Head: Normocephalic.     Right Ear: Tympanic membrane normal.     Left Ear: Tympanic membrane normal.     Nose: Nose normal.     Mouth/Throat:     Mouth: Mucous membranes are moist.  Eyes:     Extraocular Movements: Extraocular movements intact.     Pupils: Pupils are equal, round, and reactive to light.  Neck:     Musculoskeletal: Normal range of motion.  Cardiovascular:     Rate and Rhythm: Normal rate and regular rhythm.     Pulses: Normal pulses.     Heart sounds: Normal heart sounds.  Pulmonary:     Effort: Pulmonary effort is normal.     Breath sounds: Normal breath sounds.  Abdominal:     General: Abdomen is flat.     Palpations: Abdomen is soft.     Comments: + mild epigastric tenderness, no rebound   Musculoskeletal: Normal range of motion.  Skin:    General: Skin is warm.     Capillary Refill: Capillary refill takes less than 2 seconds.  Neurological:     General: No focal deficit present.     Mental Status: He is alert and oriented to person, place, and time.  Psychiatric:        Mood and Affect: Mood normal.        Behavior: Behavior normal.      ED Treatments / Results  Labs (all labs ordered are listed, but only abnormal results are displayed) Labs Reviewed  URINALYSIS, ROUTINE W REFLEX MICROSCOPIC - Abnormal; Notable for the following components:      Result Value   Protein, ur 100 (*)    All other components within normal limits  COMPREHENSIVE METABOLIC PANEL - Abnormal; Notable for the following components:   Chloride 97 (*)    Glucose, Bld  167 (*)    Creatinine, Ser 1.37 (*)    Total Bilirubin 1.3 (*)    GFR calc non Af Amer 48 (*)    GFR calc Af Amer 55 (*)    All other components within normal limits  CBC - Abnormal; Notable for the following components:   Hemoglobin 17.7 (*)    All other components within  normal limits  CBG MONITORING, ED - Abnormal; Notable for the following components:   Glucose-Capillary 156 (*)    All other components within normal limits  LIPASE, BLOOD  TROPONIN I (HIGH SENSITIVITY)    EKG EKG Interpretation  Date/Time:  Tuesday November 06 2018 10:36:24 EDT Ventricular Rate:  86 PR Interval:    QRS Duration: 78 QT Interval:  394 QTC Calculation: 472 R Axis:   -44 Text Interpretation:  Atrial fibrillation Left axis deviation Abnormal R-wave progression, late transition poor baseline. possible aflutter but rate controlled  Confirmed by Wandra Arthurs [phone]) on 11/06/2018 10:41:59 AM   Radiology No results found.  Procedures Procedures (including critical care time)  Medications Ordered in ED Medications  sodium chloride flush (NS) 0.9 % injection 3 mL (has no administration in time range)  sodium chloride flush (NS) 0.9 % injection 3 mL (has no administration in time range)  ondansetron (ZOFRAN) injection 4 mg (has no administration in time range)  sodium chloride 0.9 % bolus 1,000 mL (1,000 mLs Intravenous New Bag/Given 11/06/18 1044)     Initial Impression / Assessment and Plan / ED Course  I have reviewed the triage vital signs and the nursing notes.  Pertinent labs & imaging results that were available during my care of the patient were reviewed by me and considered in my medical decision making (see chart for details).       MAJER BANSAL is a 85 y.o. male here with poor appetite, epigastric pain. Consider viral gastro vs biliary colic. Will get labs, UA, CT ab/pel. Will hydrate and reassess.   1:11 PM Labs unremarkable. CT showed inflammation in LLQ. But since WBC  is normal and has no fever, I doubt infectious etiology. He also has enlarged prostate gland. Per son, he is undergoing oral chemo for prostate cancer. 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 I wonder if he has metastatic prostate cancer. He follows up with [facility] and I recommend that son calls them to get follow up. Will also have patient follow up with GI for colonoscopy. Will dc home with zofran prn    Final Clinical Impressions(s) / ED Diagnoses   Final diagnoses:  None    ED Discharge Orders    None       Drenda Freeze, MD 11/06/18 1313

## 2019-01-23 ENCOUNTER — Other Ambulatory Visit: Payer: Self-pay | Admitting: Cardiology

## 2019-01-23 DIAGNOSIS — I10 Essential (primary) hypertension: Secondary | ICD-10-CM

## 2019-01-23 DIAGNOSIS — I4891 Unspecified atrial fibrillation: Secondary | ICD-10-CM

## 2019-03-05 ENCOUNTER — Other Ambulatory Visit: Payer: Self-pay | Admitting: Gastroenterology

## 2019-03-05 DIAGNOSIS — K529 Noninfective gastroenteritis and colitis, unspecified: Secondary | ICD-10-CM

## 2019-03-05 DIAGNOSIS — R9389 Abnormal findings on diagnostic imaging of other specified body structures: Secondary | ICD-10-CM

## 2019-03-13 ENCOUNTER — Ambulatory Visit (INDEPENDENT_AMBULATORY_CARE_PROVIDER_SITE_OTHER): Payer: Medicare PPO | Admitting: Cardiology

## 2019-03-13 ENCOUNTER — Other Ambulatory Visit: Payer: Self-pay

## 2019-03-13 ENCOUNTER — Encounter: Payer: Self-pay | Admitting: Cardiology

## 2019-03-13 VITALS — BP 122/84 | HR 88 | Temp 97.9°F | Resp 16 | Ht 66.0 in | Wt 185.6 lb

## 2019-03-13 DIAGNOSIS — R600 Localized edema: Secondary | ICD-10-CM

## 2019-03-13 DIAGNOSIS — I10 Essential (primary) hypertension: Secondary | ICD-10-CM

## 2019-03-13 DIAGNOSIS — I4821 Permanent atrial fibrillation: Secondary | ICD-10-CM

## 2019-03-13 NOTE — Progress Notes (Signed)
Primary Physician/Referring:  Benito Mccreedy, MD  Patient ID: Dakota Scott, male    DOB: 1933/03/10, 86 y.o.   MRN: [id number]  Chief Complaint  Patient presents with  . Atrial Fibrillation  . Hypertension   HPI:    HPI: Dakota Scott  is a 86 y.o. male  peripheral arterial disease by lower extremity arterial duplex 08/25/2016, hypertension, controlled diabetes mellitus with stage 3 CKD, hyperlipidemia, presents for f/u of atrial fibrillation. He had gone to see his nephrologist for diabetic nephropathy,at that time he was found to have irregular heart rhythm, EKG obtained by PCP revealed atrial fibrillation on 04/05/2018.  No specific symptoms, no palpitations, no fatigue, no dyspnea.  Denies  chest pain.  No bleeding diathesis on Xarelto.  No GI bleed.  Except for neuropathic pain in his feet, no other specific complaints today.  Past Medical History:  Diagnosis Date  . Acute hepatitis C without mention of hepatic coma(070.51)   . Atrial fibrillation ([place]) 09/07/2018  . Claudication, intermittent ([place]) 04/25/2018  . Diabetes mellitus without complication ([place])   . Gout   . Hyperlipidemia   . Hypertension   . Prostate cancer [facility])    Past Surgical History:  Procedure Laterality Date  . EYE SURGERY  2009   cataract   Social History   Socioeconomic History  . Marital status: Married    Spouse name: Not on file  . Number of children: 7  . Years of education: Not on file  . Highest education level: Not on file  Occupational History  . Not on file  Tobacco Use  . Smoking status: Never Smoker  . Smokeless tobacco: Never Used  Substance and Sexual Activity  . Alcohol use: Not Currently  . Drug use: Never  . Sexual activity: Not Currently  Other Topics Concern  . 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 Difficulty of Paying Living Expenses: Not on file  Food Insecurity:   . Worried About Charity fundraiser in the Last Year: Not on file  . Ran Out of Food in the Last Year: Not on file  Transportation Needs:   . Lack of Transportation (Medical): Not on file  . Lack of Transportation (Non-Medical): Not on file  Physical Activity:   . Days of Exercise per Week: Not on file  . Minutes of Exercise per Session: Not on file  Stress:   . Feeling of Stress : Not on file  Social Connections:   . Frequency of Communication with Friends and Family: Not on file  . Frequency of Social Gatherings with Friends and Family: Not on file  . Attends Religious Services: Not on file  . Active Member of Clubs or Organizations: Not on file  . Attends Archivist Meetings: Not on file  . Marital Status: Not on file  Intimate Partner Violence:   . Fear of Current or Ex-Partner: Not on file  . Emotionally Abused: Not on file  . Physically Abused: Not on file  . Sexually Abused: Not on file   ROS  Review of Systems  Constitution: Negative for chills, decreased appetite, malaise/fatigue and weight gain.  Cardiovascular: Negative for  claudication, dyspnea on exertion, leg swelling and syncope.  Endocrine: Negative for cold intolerance.  Hematologic/Lymphatic: Does not bruise/bleed easily.  Musculoskeletal: Positive for back pain and joint pain. Negative for joint swelling.  Gastrointestinal: Negative for abdominal pain, anorexia, change in bowel habit, hematochezia and melena.  Genitourinary: Positive for frequency.  Neurological: Positive for paresthesias (feet). Negative for headaches and light-headedness.  Psychiatric/Behavioral: Negative for depression and substance abuse.  All other systems reviewed and are negative.  Objective  Blood pressure 122/84, pulse 88, temperature 97.9 F  (36.6 C), temperature source Temporal, resp. rate 16, height 5\' 6"  (1.676 m), weight 185 lb 9.6 oz (84.2 kg), SpO2 98 %. Body mass index is 29.96 kg/m.   Physical Exam  Constitutional: He appears well-developed. No distress.  Mildly obese  HENT:  Head: Atraumatic.  Eyes: Conjunctivae are normal.  Neck: No JVD present. No thyromegaly present.  Cardiovascular: Normal rate, S1 normal, S2 normal and intact distal pulses. An irregularly irregular rhythm present. Exam reveals no gallop.  Murmur (2/6 midsystolic murmur at the apex) heard. Pulses:      Carotid pulses are 2+ on the right side and 2+ on the left side.      Radial pulses are 2+ on the right side and 2+ on the left side.       Femoral pulses are 2+ on the right side and 2+ on the left side.      Popliteal pulses are 1+ on the right side and 1+ on the left side.       Dorsalis pedis pulses are 1+ on the right side and 1+ on the left side.       Posterior tibial pulses are 1+ on the right side and 1+ on the left side.  Skin is warm.  Capillary fill normal. No edema.   Pulmonary/Chest: Effort normal and breath sounds normal.  Abdominal: Soft. Bowel sounds are normal.  Musculoskeletal:        General: Normal range of motion.     Cervical back: Neck supple.  Neurological: He is alert.  Skin: Skin is warm and dry.  Psychiatric: He has a normal mood and affect.   Radiology: No results found.  Laboratory examination:    CMP Latest Ref Rng & Units 11/06/2018 04/26/2018 08/30/2016  Glucose 70 - 99 mg/dL 167(H) 204(H) -  BUN 8 - 23 mg/dL 22 24 -  Creatinine 0.61 - 1.24 mg/dL 1.37(H) 1.35(H) 1.30(H)  Sodium 135 - 145 mmol/L 137 140 -  Potassium 3.5 - 5.1 mmol/L 4.0 3.9 -  Chloride 98 - 111 mmol/L 97(L) 98 -  CO2 22 - 32 mmol/L 25 28 -  Calcium 8.9 - 10.3 mg/dL 9.5 9.4 -  Total Protein 6.5 - 8.1 g/dL 7.9 - -  Total Bilirubin 0.3 - 1.2 mg/dL 1.3(H) - -  Alkaline Phos 38 - 126 U/L 85 - -  AST 15 - 41 U/L 27 - -  ALT 0 - 44 U/L 21 -  -   CBC Latest Ref Rng & Units 11/06/2018 02/10/2011 01/29/2011  WBC 4.0 - 10.5 K/uL 9.2 4.7 -  Hemoglobin 13.0 - 17.0 g/dL 17.7(H) 14.9 16.0  Hematocrit 39.0 - 52.0 % 50.0 43.5 47.0  Platelets 150 - 400 K/uL 214 207.0 -   Lipid Panel     Component Value Date/Time   CHOL 159 04/26/2018 0956   TRIG 143 04/26/2018 0956   HDL 30 (L) 04/26/2018 0956   CHOLHDL 5 02/10/2011 0939   VLDL 19.8  02/10/2011 0939   LDLCALC 100 (H) 04/26/2018 0956   HEMOGLOBIN A1C Lab Results  Component Value Date   HGBA1C 7.1 (H) 02/10/2011   TSH No results for input(s): TSH in the last 8760 hours.   Outside labs:  Cholesterol, total 153.000 M 08/17/2018 HDL 32.000 M 08/17/2018 LDL 100.000 04/26/2018 Triglycerides 183.000 M 08/17/2018 A1C 9.500 % 08/17/2018 Hemoglobin 17.700 11/06/2018 Creatinine, Serum 1.370 11/06/2018 Potassium 4.000 11/06/2018 Magnesium N/D ALT (SGPT) 21.000 11/06/2018 TSH 2.450 04/05/2018 BNP N/D INR N/D Platelets 214.000 11/06/2018 Medications   Current Outpatient Medications  Medication Instructions  . allopurinol (ZYLOPRIM) 100 MG tablet TAKE 1 TABLET BY MOUTH DAILY.  Marland Kitchen amLODipine (NORVASC) 10 MG tablet TAKE 1 TABLET BY MOUTH DAILY.  Marland Kitchen Ascorbic Acid (VITAMIN C) 500 MG CHEW 1 tablet, Oral, Daily  . azithromycin (ZITHROMAX) 250 mg, Oral, Every other day  . Cholecalciferol (D3-1000) 1,000 Units, Oral, Daily  . colchicine 0.6 MG tablet At onset of gout flare take 2 tablets, may take 1 tablet 1 hr later if not relieved. May do this daily up to three days.  Marland Kitchen doxazosin (CARDURA) 2 mg, Oral, Daily at bedtime  . finasteride (PROPECIA) 5 mg, Oral, Daily  . furosemide (LASIX) 40 mg, Oral, Daily  . gabapentin (NEURONTIN) 300 mg, Oral, 3 times daily, PRN  . glimepiride (AMARYL) 2 mg, Oral, 2 times daily  . labetalol (NORMODYNE) 100 MG tablet TAKE 1 TABLET (100MG ) BY MOUTH TWICE DAILY  . losartan (COZAAR) 100 mg, Oral, Daily  . metFORMIN (GLUCOPHAGE-XR) 500 mg, Oral, 2 times daily  .  metoCLOPramide (REGLAN) 5 MG/5ML solution 5 mLs, Oral, 3 times daily  . pravastatin (PRAVACHOL) 20 mg, Oral, Daily at bedtime  . prednisoLONE acetate (PRED FORTE) 1 % ophthalmic suspension 1 drop, Both Eyes, 2 times daily  . XARELTO 15 MG TABS tablet TAKE 1 TABLET BY MOUTH ONCE EVERY DAY WITH SUPPER   Cardiac Studies:   Lexiscan Sestamibi stress test 04/16/2018: 1. Lexiscan stress test was performed. Exercise capacity was not assessed. No stress symptoms reported. Resting blood pressure was 150/110 mmHg and peak effect blood pressure was 110/78 mmHg. The resting and stress electrocardiogram demonstrated atrial fibrillation, normal resting conduction, low voltage, and normal rest repolarization. Stress EKG is non diagnostic for ischemia as it is a pharmacologic stress. 2. The overall quality of the study is good. There is no evidence of abnormal lung activity. Stress and rest SPECT images demonstrate homogeneous tracer distribution throughout the myocardium. Gated SPECT imaging reveals normal myocardial thickening and wall motion. The left ventricular ejection fraction was normal (57%).  3. Low risk study.  Outside Echocardiogram 09/06/2013: Normal LV systolic function, mild LVH, grade 1 diastolic dysfunction.  Lower extremity arterial duplex 12/01/2015: No hemodynamically significant stenoses are identified in thelower extremity arterial system. This exam reveals moderately decreased perfusion of the lower extremity, with RABI 0.76 and LABI 0.66 noted at the post tibial artery level. There is diffuse plaque and abnormal waveform in vessels below knee suggests small vessel disease.  Assessment     ICD-10-CM   1. Permanent atrial fibrillation (HCC) CHA2DS2-VASc Score is 4.  Yearly risk of stroke: 6.3% (A, HTN, DM, Vasc Dz)  I48.21 EKG 12-Lead  2. Essential hypertension  I10   3. Leg edema  R60.0     EKG 03/13/2019: Atrial fibrillation with controlled ventricular response at rate of 91 bpm,  leftward axis, incomplete right bundle branch block.  Poor R-wave progression, cannot exclude anteroseptal infarct old.  Nonspecific T abnormality.  No significant change from  EKG 09/07/2018   Recommendations:   DWAIN MERLAN  is a 86 y.o. male  peripheral arterial disease by lower extremity arterial duplex 08/25/2016, hypertension, controlled diabetes mellitus with stage 3 CKD, hyperlipidemia, presents for f/u of atrial fibrillation.  Metoprolol discontinued on his prior visit due to low HR and switched to Labetalol, tolerating this well.  He has not had any bleeding diathesis on Xarelto.  His leg edema has essentially resolved since last office visit and his blood pressure is also well controlled. I reviewed his labs, renal function and CBC stable. Continue present medications.   No changes in the medications were done today.  No clinical evidence of heart failure.  I will see him back on an annual basis. Continued paresthesia involving both feet is related to diabetic peripheral neuropathy.  He probably also has small vessel disease but I am not convinced PAD is contributing to his symptoms. I will see him back in 6 months.   Adrian Prows, MD, [facility] 03/13/2019, 3:47 PM [facility]

## 2019-03-15 ENCOUNTER — Other Ambulatory Visit: Payer: Medicare Other

## 2019-03-18 ENCOUNTER — Other Ambulatory Visit: Payer: Self-pay

## 2019-03-18 ENCOUNTER — Ambulatory Visit
Admission: RE | Admit: 2019-03-18 | Discharge: 2019-03-18 | Disposition: A | Discharge status: Home / Self Care | Payer: Medicare PPO | Source: Ambulatory Visit | Attending: Gastroenterology | Admitting: Gastroenterology

## 2019-03-18 DIAGNOSIS — K529 Noninfective gastroenteritis and colitis, unspecified: Secondary | ICD-10-CM

## 2019-03-18 DIAGNOSIS — K8020 Calculus of gallbladder without cholecystitis without obstruction: Secondary | ICD-10-CM | POA: Diagnosis not present

## 2019-03-18 DIAGNOSIS — R9389 Abnormal findings on diagnostic imaging of other specified body structures: Secondary | ICD-10-CM

## 2019-03-18 MED ORDER — IOPAMIDOL (ISOVUE-300) INJECTION 61%
100.0000 mL | Freq: Once | INTRAVENOUS | Status: AC | PRN
  Administered 2019-03-18: 100 mL via INTRAVENOUS

## 2019-04-18 DIAGNOSIS — I10 Essential (primary) hypertension: Secondary | ICD-10-CM | POA: Diagnosis not present

## 2019-04-18 DIAGNOSIS — E559 Vitamin D deficiency, unspecified: Secondary | ICD-10-CM | POA: Diagnosis not present

## 2019-04-18 DIAGNOSIS — E782 Mixed hyperlipidemia: Secondary | ICD-10-CM | POA: Diagnosis not present

## 2019-04-18 DIAGNOSIS — E1165 Type 2 diabetes mellitus with hyperglycemia: Secondary | ICD-10-CM | POA: Diagnosis not present

## 2019-04-18 DIAGNOSIS — R609 Edema, unspecified: Secondary | ICD-10-CM | POA: Diagnosis not present

## 2019-04-18 DIAGNOSIS — I480 Paroxysmal atrial fibrillation: Secondary | ICD-10-CM | POA: Diagnosis not present

## 2019-04-18 DIAGNOSIS — G629 Polyneuropathy, unspecified: Secondary | ICD-10-CM | POA: Diagnosis not present

## 2019-04-18 DIAGNOSIS — Z03818 Encounter for observation for suspected exposure to other biological agents ruled out: Secondary | ICD-10-CM | POA: Diagnosis not present

## 2019-04-18 DIAGNOSIS — M109 Gout, unspecified: Secondary | ICD-10-CM | POA: Diagnosis not present

## 2019-04-18 DIAGNOSIS — M1710 Unilateral primary osteoarthritis, unspecified knee: Secondary | ICD-10-CM | POA: Diagnosis not present

## 2019-04-22 ENCOUNTER — Other Ambulatory Visit: Payer: Self-pay | Admitting: Cardiology

## 2019-04-22 DIAGNOSIS — I4821 Permanent atrial fibrillation: Secondary | ICD-10-CM

## 2019-04-25 DIAGNOSIS — D631 Anemia in chronic kidney disease: Secondary | ICD-10-CM | POA: Diagnosis not present

## 2019-04-25 DIAGNOSIS — N2581 Secondary hyperparathyroidism of renal origin: Secondary | ICD-10-CM | POA: Diagnosis not present

## 2019-04-25 DIAGNOSIS — M109 Gout, unspecified: Secondary | ICD-10-CM | POA: Diagnosis not present

## 2019-04-25 DIAGNOSIS — E1122 Type 2 diabetes mellitus with diabetic chronic kidney disease: Secondary | ICD-10-CM | POA: Diagnosis not present

## 2019-04-25 DIAGNOSIS — N182 Chronic kidney disease, stage 2 (mild): Secondary | ICD-10-CM | POA: Diagnosis not present

## 2019-04-25 DIAGNOSIS — N400 Enlarged prostate without lower urinary tract symptoms: Secondary | ICD-10-CM | POA: Diagnosis not present

## 2019-04-25 DIAGNOSIS — I129 Hypertensive chronic kidney disease with stage 1 through stage 4 chronic kidney disease, or unspecified chronic kidney disease: Secondary | ICD-10-CM | POA: Diagnosis not present

## 2019-04-25 DIAGNOSIS — I4891 Unspecified atrial fibrillation: Secondary | ICD-10-CM | POA: Diagnosis not present

## 2019-04-25 DIAGNOSIS — E785 Hyperlipidemia, unspecified: Secondary | ICD-10-CM | POA: Diagnosis not present

## 2019-04-26 ENCOUNTER — Ambulatory Visit: Payer: Medicare PPO | Attending: Internal Medicine

## 2019-04-26 DIAGNOSIS — Z23 Encounter for immunization: Secondary | ICD-10-CM

## 2019-04-26 NOTE — Progress Notes (Signed)
   Covid-19 Vaccination Clinic  Name:  Dakota Scott    MRN: [id number] DOB: March 14, 1933  04/26/2019  [name] was observed post Covid-19 immunization for 15 minutes without incident. He was provided with Vaccine Information Sheet and instruction to access the V-Safe system.   [name] was instructed to call 911 with any severe reactions post vaccine: Marland Kitchen Difficulty breathing  . Swelling of face and throat  . A fast heartbeat  . A bad rash all over body  . Dizziness and weakness   Immunizations Administered    Name Date Dose VIS Date Route   Pfizer COVID-19 Vaccine 04/26/2019 11:00 AM 0.3 mL 02/01/2019 Intramuscular   Manufacturer: [place]   Lot: [id number]   [place]: [id number]

## 2019-05-07 DIAGNOSIS — I129 Hypertensive chronic kidney disease with stage 1 through stage 4 chronic kidney disease, or unspecified chronic kidney disease: Secondary | ICD-10-CM | POA: Diagnosis not present

## 2019-05-22 ENCOUNTER — Ambulatory Visit: Payer: Medicare PPO | Attending: Internal Medicine

## 2019-05-22 DIAGNOSIS — Z23 Encounter for immunization: Secondary | ICD-10-CM

## 2019-05-22 NOTE — Progress Notes (Signed)
   Covid-19 Vaccination Clinic  Name:  Dakota Scott    MRN: [id number] DOB: 02-23-1933  05/22/2019  [name] was observed post Covid-19 immunization for 15 minutes without incident. He was provided with Vaccine Information Sheet and instruction to access the V-Safe system.   [name] was instructed to call 911 with any severe reactions post vaccine: Marland Kitchen Difficulty breathing  . Swelling of face and throat  . A fast heartbeat  . A bad rash all over body  . Dizziness and weakness   Immunizations Administered    Name Date Dose VIS Date Route   Pfizer COVID-19 Vaccine 05/22/2019 10:59 AM 0.3 mL 02/01/2019 Intramuscular   Manufacturer: [employer], [employer]   Lot: [id number]   [place]: [id number]

## 2019-05-23 DIAGNOSIS — H401231 Low-tension glaucoma, bilateral, mild stage: Secondary | ICD-10-CM | POA: Diagnosis not present

## 2019-06-05 DIAGNOSIS — N401 Enlarged prostate with lower urinary tract symptoms: Secondary | ICD-10-CM | POA: Diagnosis not present

## 2019-06-05 DIAGNOSIS — C61 Malignant neoplasm of prostate: Secondary | ICD-10-CM | POA: Diagnosis not present

## 2019-06-05 DIAGNOSIS — R351 Nocturia: Secondary | ICD-10-CM | POA: Diagnosis not present

## 2019-07-16 ENCOUNTER — Other Ambulatory Visit: Payer: Self-pay | Admitting: Cardiology

## 2019-07-16 DIAGNOSIS — I4821 Permanent atrial fibrillation: Secondary | ICD-10-CM

## 2019-08-13 DIAGNOSIS — E559 Vitamin D deficiency, unspecified: Secondary | ICD-10-CM | POA: Diagnosis not present

## 2019-08-13 DIAGNOSIS — E1165 Type 2 diabetes mellitus with hyperglycemia: Secondary | ICD-10-CM | POA: Diagnosis not present

## 2019-08-13 DIAGNOSIS — Z125 Encounter for screening for malignant neoplasm of prostate: Secondary | ICD-10-CM | POA: Diagnosis not present

## 2019-08-13 DIAGNOSIS — I480 Paroxysmal atrial fibrillation: Secondary | ICD-10-CM | POA: Diagnosis not present

## 2019-08-13 DIAGNOSIS — I10 Essential (primary) hypertension: Secondary | ICD-10-CM | POA: Diagnosis not present

## 2019-08-13 DIAGNOSIS — G629 Polyneuropathy, unspecified: Secondary | ICD-10-CM | POA: Diagnosis not present

## 2019-08-13 DIAGNOSIS — M109 Gout, unspecified: Secondary | ICD-10-CM | POA: Diagnosis not present

## 2019-08-13 DIAGNOSIS — Z0001 Encounter for general adult medical examination with abnormal findings: Secondary | ICD-10-CM | POA: Diagnosis not present

## 2019-08-13 DIAGNOSIS — E782 Mixed hyperlipidemia: Secondary | ICD-10-CM | POA: Diagnosis not present

## 2019-08-24 ENCOUNTER — Emergency Department (HOSPITAL_BASED_OUTPATIENT_CLINIC_OR_DEPARTMENT_OTHER)
Admission: EM | Admit: 2019-08-24 | Discharge: 2019-08-24 | Disposition: A | Discharge status: Home / Self Care | Payer: Medicare PPO | Attending: Emergency Medicine | Admitting: Emergency Medicine

## 2019-08-24 ENCOUNTER — Encounter (HOSPITAL_BASED_OUTPATIENT_CLINIC_OR_DEPARTMENT_OTHER): Payer: Self-pay

## 2019-08-24 ENCOUNTER — Other Ambulatory Visit: Payer: Self-pay

## 2019-08-24 ENCOUNTER — Emergency Department (HOSPITAL_BASED_OUTPATIENT_CLINIC_OR_DEPARTMENT_OTHER): Payer: Medicare PPO

## 2019-08-24 DIAGNOSIS — I10 Essential (primary) hypertension: Secondary | ICD-10-CM | POA: Diagnosis not present

## 2019-08-24 DIAGNOSIS — G9389 Other specified disorders of brain: Secondary | ICD-10-CM | POA: Diagnosis not present

## 2019-08-24 DIAGNOSIS — R531 Weakness: Secondary | ICD-10-CM | POA: Insufficient documentation

## 2019-08-24 DIAGNOSIS — N179 Acute kidney failure, unspecified: Secondary | ICD-10-CM | POA: Diagnosis not present

## 2019-08-24 DIAGNOSIS — E860 Dehydration: Secondary | ICD-10-CM | POA: Diagnosis not present

## 2019-08-24 DIAGNOSIS — G319 Degenerative disease of nervous system, unspecified: Secondary | ICD-10-CM | POA: Diagnosis not present

## 2019-08-24 DIAGNOSIS — Z79899 Other long term (current) drug therapy: Secondary | ICD-10-CM | POA: Insufficient documentation

## 2019-08-24 DIAGNOSIS — R42 Dizziness and giddiness: Secondary | ICD-10-CM | POA: Diagnosis not present

## 2019-08-24 DIAGNOSIS — M545 Low back pain: Secondary | ICD-10-CM | POA: Diagnosis not present

## 2019-08-24 DIAGNOSIS — E119 Type 2 diabetes mellitus without complications: Secondary | ICD-10-CM | POA: Diagnosis not present

## 2019-08-24 DIAGNOSIS — Z7984 Long term (current) use of oral hypoglycemic drugs: Secondary | ICD-10-CM | POA: Diagnosis not present

## 2019-08-24 LAB — COMPREHENSIVE METABOLIC PANEL
ALT: 18 U/L (ref 0–44)
AST: 22 U/L (ref 15–41)
Albumin: 4.1 g/dL (ref 3.5–5.0)
Alkaline Phosphatase: 72 U/L (ref 38–126)
Anion gap: 10 (ref 5–15)
BUN: 31 mg/dL — ABNORMAL HIGH (ref 8–23)
CO2: 28 mmol/L (ref 22–32)
Calcium: 9.4 mg/dL (ref 8.9–10.3)
Chloride: 101 mmol/L (ref 98–111)
Creatinine, Ser: 1.7 mg/dL — ABNORMAL HIGH (ref 0.61–1.24)
GFR calc Af Amer: 42 mL/min — ABNORMAL LOW (ref >60)
GFR calc non Af Amer: 36 mL/min — ABNORMAL LOW (ref >60)
Glucose, Bld: 259 mg/dL — ABNORMAL HIGH (ref 70–99)
Potassium: 4.2 mmol/L (ref 3.5–5.1)
Sodium: 139 mmol/L (ref 135–145)
Total Bilirubin: 0.9 mg/dL (ref 0.3–1.2)
Total Protein: 8.1 g/dL (ref 6.5–8.1)

## 2019-08-24 LAB — CBC WITH DIFFERENTIAL/PLATELET
Abs Immature Granulocytes: 0.02 10*3/uL (ref 0.00–0.07)
Basophils Absolute: 0 10*3/uL (ref 0.0–0.1)
Basophils Relative: 1 %
Eosinophils Absolute: 0 10*3/uL (ref 0.0–0.5)
Eosinophils Relative: 1 %
HCT: 44.8 % (ref 39.0–52.0)
Hemoglobin: 15.2 g/dL (ref 13.0–17.0)
Immature Granulocytes: 0 %
Lymphocytes Relative: 29 %
Lymphs Abs: 1.5 10*3/uL (ref 0.7–4.0)
MCH: 31.2 pg (ref 26.0–34.0)
MCHC: 33.9 g/dL (ref 30.0–36.0)
MCV: 92 fL (ref 80.0–100.0)
Monocytes Absolute: 0.4 10*3/uL (ref 0.1–1.0)
Monocytes Relative: 8 %
Neutro Abs: 3.3 10*3/uL (ref 1.7–7.7)
Neutrophils Relative %: 61 %
Platelets: 211 10*3/uL (ref 150–400)
RBC: 4.87 MIL/uL (ref 4.22–5.81)
RDW: 12.1 % (ref 11.5–15.5)
WBC: 5.3 10*3/uL (ref 4.0–10.5)
nRBC: 0 % (ref 0.0–0.2)

## 2019-08-24 LAB — URINALYSIS, ROUTINE W REFLEX MICROSCOPIC
Bilirubin Urine: NEGATIVE
Glucose, UA: 250 mg/dL — AB
Hgb urine dipstick: NEGATIVE
Ketones, ur: NEGATIVE mg/dL
Leukocytes,Ua: NEGATIVE
Nitrite: NEGATIVE
Protein, ur: >300 mg/dL — AB
Specific Gravity, Urine: 1.025 (ref 1.005–1.030)
pH: 6.5 (ref 5.0–8.0)

## 2019-08-24 LAB — URINALYSIS, MICROSCOPIC (REFLEX)

## 2019-08-24 LAB — TROPONIN I (HIGH SENSITIVITY): Troponin I (High Sensitivity): 13 ng/L (ref <18)

## 2019-08-24 LAB — CBG MONITORING, ED: Glucose-Capillary: 187 mg/dL — ABNORMAL HIGH (ref 70–99)

## 2019-08-24 LAB — LIPASE, BLOOD: Lipase: 32 U/L (ref 11–51)

## 2019-08-24 MED ORDER — MECLIZINE HCL 25 MG PO TABS
25.0000 mg | ORAL_TABLET | Freq: Once | ORAL | Status: AC
  Administered 2019-08-24: 25 mg via ORAL
  Filled 2019-08-24: qty 1

## 2019-08-24 MED ORDER — SODIUM CHLORIDE 0.9 % IV BOLUS
1000.0000 mL | Freq: Once | INTRAVENOUS | Status: AC
  Administered 2019-08-24: 1000 mL via INTRAVENOUS

## 2019-08-24 NOTE — ED Triage Notes (Signed)
Pt c/o lightheadedness and generalized weakness that started today. Pt having urinary incontinence x 2 days.

## 2019-08-24 NOTE — ED Notes (Signed)
Patient transported to CT and radiology

## 2019-08-24 NOTE — ED Provider Notes (Signed)
[facility] EMERGENCY DEPARTMENT Provider Note   CSN: [id number] Arrival date & time: 08/24/19  1446     History Chief Complaint  Patient presents with  . Weakness    Dakota Scott is a 86 y.o. male history of A. fib on Xarelto, diabetes, prostate cancer in remission who presenting with urinary incontinence.  Last 2 days, patient has been urinating on himself.  Also has some urinary frequency as well.  Patient also has some back pain and feels lightheaded and dizzy.  Patient also feels that the room is spinning as well.  Denies any chest pain or shortness of breath.  Patient has seen urology previously and has no known mets to his back from his prostate cancer.  Denies any sick contacts or fevers or chills.  The history is provided by the patient.       Past Medical History:  Diagnosis Date  . Acute hepatitis C without mention of hepatic coma(070.51)   . Atrial fibrillation ([place]) 09/07/2018  . Claudication, intermittent ([place]) 04/25/2018  . Diabetes mellitus without complication ([place])   . Gout   . Hyperlipidemia   . Hypertension   . Prostate cancer [facility])     Patient Active Problem List   Diagnosis Date Noted  . Atrial fibrillation ([place]) 09/07/2018  . Claudication, intermittent ([place]) 04/25/2018  . Hypercholesteremia 04/10/2018  . Ischemic heart disease 04/10/2018  . Action tremor 10/08/2013  . Malignant neoplasm of prostate ([place]) 02/17/2011  . Laboratory examination 02/17/2011  . 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 Controlled diabetes mellitus ([place]) 05/06/2008  . PREMATURE VENTRICULAR CONTRACTIONS 08/08/2007  . INTERMITTENT CLAUDICATION, BILATERAL 08/08/2007  . HEPATITIS C 01/02/2007  . ONYCHOMYCOSIS 01/02/2007  . GOUT 01/02/2007  . Essential hypertension 01/02/2007    Past Surgical History:  Procedure Laterality Date  . EYE SURGERY  2009   cataract       Family History  Problem Relation Age of Onset  . Other Brother   . Hyperlipidemia  Neg Hx   . Heart disease Neg Hx   . Diabetes Neg Hx   . Cancer Neg Hx        colon, lung, and prostate cancer    Social History   Tobacco Use  . Smoking status: Never Smoker  . Smokeless tobacco: Never Used  Vaping Use  . Vaping Use: Never used  Substance Use Topics  . Alcohol use: Not Currently  . Drug use: Never    Home Medications Prior to Admission medications   Medication Sig Start Date End Date Taking? Authorizing Provider  allopurinol (ZYLOPRIM) 100 MG tablet TAKE 1 TABLET BY MOUTH DAILY. Patient taking differently: Take 100 mg by mouth daily.  03/26/12   Norins, Heinz Knuckles, MD  amLODipine (NORVASC) 10 MG tablet TAKE 1 TABLET BY MOUTH DAILY. Patient taking differently: 5 mg daily.  03/27/12   Norins, Heinz Knuckles, MD  Ascorbic Acid (VITAMIN C) 500 MG CHEW Chew 1 tablet by mouth daily.    [provider]  azithromycin (ZITHROMAX) 250 MG tablet Take 250 mg by mouth every other day.    [provider]  Cholecalciferol (D3-1000) 1000 UNITS capsule Take 1,000 Units by mouth daily.    [provider]  colchicine 0.6 MG tablet At onset of gout flare take 2 tablets, may take 1 tablet 1 hr later if not relieved. May do this daily up to three days. 01/31/11   Norins, Heinz Knuckles, MD  doxazosin (CARDURA) 2 MG tablet Take 1 tablet (  2 mg total) by mouth at bedtime. Patient taking differently: Take 4 mg by mouth at bedtime.  09/07/18   Adrian Prows, MD  finasteride (PROPECIA) 1 MG tablet Take 5 mg by mouth daily.     [provider]  furosemide (LASIX) 20 MG tablet Take 2 tablets (40 mg total) by mouth daily. Patient taking differently: Take 20 mg by mouth 2 (two) times daily.  09/07/18   Adrian Prows, MD  gabapentin (NEURONTIN) 300 MG capsule Take 300 mg by mouth 3 (three) times daily. PRN    [provider]  glimepiride (AMARYL) 2 MG tablet Take 2 mg by mouth 2 (two) times daily.     [provider]  labetalol (NORMODYNE) 100 MG tablet TAKE 1  TABLET (100MG ) BY MOUTH TWICE DAILY 01/23/19   Adrian Prows, MD  losartan (COZAAR) 100 MG tablet Take 100 mg by mouth daily.    [provider]  metFORMIN (GLUCOPHAGE-XR) 500 MG 24 hr tablet Take 500 mg by mouth 2 (two) times daily.  11/01/18   [provider]  metoCLOPramide (REGLAN) 5 MG/5ML solution Take 5 mLs by mouth 3 (three) times daily. 11/01/18   [provider]  pravastatin (PRAVACHOL) 20 MG tablet Take 20 mg by mouth at bedtime.  06/02/15   [provider]  prednisoLONE acetate (PRED FORTE) 1 % ophthalmic suspension Place 1 drop into both eyes 2 (two) times daily. 10/22/18   [provider]  XARELTO 15 MG TABS tablet TAKE 1 TABLET BY MOUTH ONCE EVERY DAY WITH SUPPER 07/16/19   Adrian Prows, MD    Allergies    Patient has no known allergies.  Review of Systems   Review of Systems  Musculoskeletal: Positive for back pain.  Neurological: Positive for dizziness and weakness.  All other systems reviewed and are negative.   Physical Exam Updated Vital Signs BP (!) 142/92 (BP Location: Right Arm)   Pulse 68   Temp 98.2 F (36.8 C) (Oral)   Resp 16   Ht 5\' 11"  (1.803 m)   Wt 83.9 kg   SpO2 98%   BMI 25.80 kg/m   Physical Exam Vitals and nursing note reviewed.  Constitutional:      Comments: Chronically ill, slightly dehydrated   HENT:     Head: Normocephalic.     Nose: Nose normal.     Mouth/Throat:     Mouth: Mucous membranes are dry.  Eyes:     Extraocular Movements: Extraocular movements intact.     Pupils: Pupils are equal, round, and reactive to light.     Comments: No obvious nystagmus   Cardiovascular:     Rate and Rhythm: Normal rate and regular rhythm.     Pulses: Normal pulses.     Heart sounds: Normal heart sounds.  Pulmonary:     Effort: Pulmonary effort is normal.     Breath sounds: Normal breath sounds.  Abdominal:     General: Abdomen is flat.     Palpations: Abdomen is soft.  Musculoskeletal:        General:  Normal range of motion.     Cervical back: Normal range of motion.     Comments: Mild diffuse lower lumbar tenderness, no saddle anesthesia, negative straight leg raise   Skin:    General: Skin is warm.     Capillary Refill: Capillary refill takes less than 2 seconds.  Neurological:     General: No focal deficit present.     Mental Status: He  is oriented to person, place, and time.     Comments: CN 2- 12 intact, nl strength throughout, nl finger to nose bilaterally, no pronator drift. Nl strength bilateral upper and lower extremities. Slightly wide based gait but able to ambulate by himself   Psychiatric:        Mood and Affect: Mood normal.     ED Results / Procedures / Treatments   Labs (all labs ordered are listed, but only abnormal results are displayed) Labs Reviewed  URINE CULTURE  CBC WITH DIFFERENTIAL/PLATELET  URINALYSIS, ROUTINE W REFLEX MICROSCOPIC  COMPREHENSIVE METABOLIC PANEL  LIPASE, BLOOD  TROPONIN I (HIGH SENSITIVITY)    EKG EKG Interpretation  Date/Time:  Saturday August 24 2019 15:01:35 EDT Ventricular Rate:  63 PR Interval:    QRS Duration: 74 QT Interval:  408 QTC Calculation: 417 R Axis:   -25 Text Interpretation: Atrial fibrillation Anterior infarct , age undetermined Abnormal ECG No significant change since last tracing Confirmed by Wandra Arthurs [phone]) on 08/24/2019 3:29:50 PM   Radiology No results found.  Procedures Procedures (including critical care time)  Medications Ordered in ED Medications  sodium chloride 0.9 % bolus 1,000 mL (has no administration in time range)  meclizine (ANTIVERT) tablet 25 mg (has no administration in time range)    ED Course  I have reviewed the triage vital signs and the nursing notes.  Pertinent labs & imaging results that were available during my care of the patient were reviewed by me and considered in my medical decision making (see chart for details).    MDM Rules/Calculators/A&P                           FERMAN BASILIO is a 86 y.o. male here with dizziness, back pain, incontinence.  Patient has no saddle anesthesia.  Consider UTI versus metastatic prostate cancer to his spine versus electrolyte abnormality.  Less likely to be posterior circulation stroke.  Patient has no neuro deficit in the lower extremities so we will hold off on MRI.  Will get CBC, CMP, UA, CT head and CT lumbar spine.  Will give IV fluids and reassess.  5:16 PM Labs showed mild AKI with Cr 1.7, baseline 1.3. UA nl. CT head unremarkable. CT lumbar spine showed no obvious bone mets. Patient orthostatic by HR criteria. Given IVF and felt better. Recommend hydration and repeat BMP in a week. Stable for discharge.   Final Clinical Impression(s) / ED Diagnoses Final diagnoses:  None    Rx / DC Orders ED Discharge Orders    None       Drenda Freeze, MD 08/24/19 [phone]

## 2019-08-24 NOTE — ED Notes (Signed)
Pt discharged to home. Discharge instructions have been discussed with patient and/or family members. Pt verbally acknowledges understanding d/c instructions, and endorses comprehension to checkout at registration before leaving.  °

## 2019-08-24 NOTE — Discharge Instructions (Addendum)
Stay hydrated   See your doctor. Repeat kidney function in a week   Return to ER if you have worse dizziness, trouble urinating, weakness, numbness, trouble walking

## 2019-08-26 LAB — URINE CULTURE: Culture: NO GROWTH

## 2019-09-09 DIAGNOSIS — N1832 Chronic kidney disease, stage 3b: Secondary | ICD-10-CM | POA: Diagnosis not present

## 2019-09-09 DIAGNOSIS — E782 Mixed hyperlipidemia: Secondary | ICD-10-CM | POA: Diagnosis not present

## 2019-09-09 DIAGNOSIS — E1165 Type 2 diabetes mellitus with hyperglycemia: Secondary | ICD-10-CM | POA: Diagnosis not present

## 2019-09-09 DIAGNOSIS — R609 Edema, unspecified: Secondary | ICD-10-CM | POA: Diagnosis not present

## 2019-09-09 DIAGNOSIS — I10 Essential (primary) hypertension: Secondary | ICD-10-CM | POA: Diagnosis not present

## 2019-09-09 DIAGNOSIS — R262 Difficulty in walking, not elsewhere classified: Secondary | ICD-10-CM | POA: Diagnosis not present

## 2019-09-09 DIAGNOSIS — M1710 Unilateral primary osteoarthritis, unspecified knee: Secondary | ICD-10-CM | POA: Diagnosis not present

## 2019-09-10 DIAGNOSIS — E782 Mixed hyperlipidemia: Secondary | ICD-10-CM | POA: Diagnosis not present

## 2019-09-16 ENCOUNTER — Other Ambulatory Visit: Payer: Self-pay

## 2019-09-16 ENCOUNTER — Ambulatory Visit: Payer: Medicare PPO | Admitting: Cardiology

## 2019-09-16 ENCOUNTER — Encounter: Payer: Self-pay | Admitting: Neurology

## 2019-09-16 ENCOUNTER — Ambulatory Visit (INDEPENDENT_AMBULATORY_CARE_PROVIDER_SITE_OTHER): Payer: Medicare PPO | Admitting: Neurology

## 2019-09-16 ENCOUNTER — Encounter: Payer: Self-pay | Admitting: Cardiology

## 2019-09-16 ENCOUNTER — Telehealth: Payer: Self-pay | Admitting: Neurology

## 2019-09-16 VITALS — BP 144/92 | HR 91 | Ht 71.0 in | Wt 187.0 lb

## 2019-09-16 VITALS — BP 134/88 | HR 70 | Resp 16 | Ht 71.0 in | Wt 187.0 lb

## 2019-09-16 DIAGNOSIS — E1142 Type 2 diabetes mellitus with diabetic polyneuropathy: Secondary | ICD-10-CM | POA: Diagnosis not present

## 2019-09-16 DIAGNOSIS — R202 Paresthesia of skin: Secondary | ICD-10-CM | POA: Diagnosis not present

## 2019-09-16 DIAGNOSIS — N1831 Chronic kidney disease, stage 3a: Secondary | ICD-10-CM

## 2019-09-16 DIAGNOSIS — I4821 Permanent atrial fibrillation: Secondary | ICD-10-CM | POA: Diagnosis not present

## 2019-09-16 DIAGNOSIS — E1151 Type 2 diabetes mellitus with diabetic peripheral angiopathy without gangrene: Secondary | ICD-10-CM | POA: Diagnosis not present

## 2019-09-16 DIAGNOSIS — R0609 Other forms of dyspnea: Secondary | ICD-10-CM | POA: Diagnosis not present

## 2019-09-16 DIAGNOSIS — I10 Essential (primary) hypertension: Secondary | ICD-10-CM | POA: Diagnosis not present

## 2019-09-16 DIAGNOSIS — R531 Weakness: Secondary | ICD-10-CM

## 2019-09-16 MED ORDER — TOPIRAMATE 25 MG PO TABS: 25.0000 mg | ORAL_TABLET | Freq: Two times a day (BID) | ORAL | 3 refills | Status: DC

## 2019-09-16 MED ORDER — HYDRALAZINE HCL 25 MG PO TABS: 25.0000 mg | ORAL_TABLET | Freq: Three times a day (TID) | ORAL | 2 refills | Status: DC

## 2019-09-16 NOTE — Telephone Encounter (Signed)
Pt needs 6 week follow-up scheduled but there are no openings until 12 weeks out. Advised pt that we will need to wait until an appointment becomes open before we can schedule 6 weeks. Pt can be reached at [phone].

## 2019-09-16 NOTE — Progress Notes (Signed)
Primary Physician/Referring:  Benito Mccreedy, MD  Patient ID: Scott BENNEY, male    DOB: August 21, 1933, 86 y.o.   MRN: [id number]  Chief Complaint  Patient presents with  . Hospitalization Follow-up  . Atrial Fibrillation   HPI:    HPI: Dakota Scott  is a 86 y.o. male  peripheral arterial disease by lower extremity arterial duplex 08/25/2016, hypertension, controlled diabetes mellitus with stage 3 CKD, hyperlipidemia, permanent atrial fibrillation presents for f/u of atrial fibrillation.     He has chronic neuropathic pain in his feet, he now c/o generalized aches and pain and also generalized weakness and dyspnea on exertion.   Patient was seen in the emergency room on 08/24/2019 when he presented with generalized weakness, dizziness, was found to be mildly dehydrated and also in acute renal failure with serum creatinine of 1.7, received IV fluids and discharged home.  Past Medical History:  Diagnosis Date  . Acute hepatitis C without mention of hepatic coma(070.51)   . Atrial fibrillation ([place]) 09/07/2018  . Claudication, intermittent ([place]) 04/25/2018  . Diabetes mellitus without complication ([place])   . Gout   . Hyperlipidemia   . Hypertension   . Prostate cancer [facility])    Past Surgical History:  Procedure Laterality Date  . EYE SURGERY  2009   cataract   Social History   Tobacco Use  . Smoking status: Never Smoker  . Smokeless tobacco: Never Used  Substance Use Topics  . Alcohol use: Not Currently  Marital Status: Married   ROS  Review of Systems  Constitutional: Positive for malaise/fatigue.  Cardiovascular: Positive for dyspnea on exertion and leg swelling. Negative for chest pain.  Musculoskeletal: Positive for arthritis and back pain.  Gastrointestinal: Negative for melena.  Genitourinary: Positive for frequency.   Objective  Blood pressure (!) 134/88, pulse 70, resp. rate 16, height 5\' 11"  (1.803 m), weight 187 lb (84.8 kg), SpO2 99 %. Body mass index is  26.08 kg/m.   Physical Exam Constitutional:      General: He is not in acute distress.    Appearance: He is well-developed.     Comments: Mildly obese  Neck:     Thyroid: No thyromegaly.     Vascular: No JVD.  Cardiovascular:     Rate and Rhythm: Normal rate. Rhythm irregular.     Pulses:          Carotid pulses are 2+ on the right side and 2+ on the left side.      Radial pulses are 2+ on the right side and 2+ on the left side.       Femoral pulses are 2+ on the right side and 2+ on the left side.      Popliteal pulses are 1+ on the right side and 1+ on the left side.       Dorsalis pedis pulses are 1+ on the right side and 1+ on the left side.       Posterior tibial pulses are 1+ on the right side and 1+ on the left side.     Heart sounds: S1 normal and S2 normal. No murmur heard.  No gallop.      Comments: Skin is warm.  Capillary fill normal.  1+ bilateral ankle edema  Pulmonary:     Effort: Pulmonary effort is normal.     Breath sounds: Normal breath sounds.  Abdominal:     General: Bowel sounds are normal.     Palpations: Abdomen is soft.  Radiology: No results found.  Laboratory examination:    CMP Latest Ref Rng & Units 08/24/2019 11/06/2018 04/26/2018  Glucose 70 - 99 mg/dL 259(H) 167(H) 204(H)  BUN 8 - 23 mg/dL 31(H) 22 24  Creatinine 0.61 - 1.24 mg/dL 1.70(H) 1.37(H) 1.35(H)  Sodium 135 - 145 mmol/L 139 137 140  Potassium 3.5 - 5.1 mmol/L 4.2 4.0 3.9  Chloride 98 - 111 mmol/L 101 97(L) 98  CO2 22 - 32 mmol/L 28 25 28   Calcium 8.9 - 10.3 mg/dL 9.4 9.5 9.4  Total Protein 6.5 - 8.1 g/dL 8.1 7.9 -  Total Bilirubin 0.3 - 1.2 mg/dL 0.9 1.3(H) -  Alkaline Phos 38 - 126 U/L 72 85 -  AST 15 - 41 U/L 22 27 -  ALT 0 - 44 U/L 18 21 -   CBC Latest Ref Rng & Units 08/24/2019 11/06/2018 02/10/2011  WBC 4.0 - 10.5 K/uL 5.3 9.2 4.7  Hemoglobin 13.0 - 17.0 g/dL 15.2 17.7(H) 14.9  Hematocrit 39 - 52 % 44.8 50.0 43.5  Platelets 150 - 400 K/uL 211 214 207.0   Lipid Panel      Component Value Date/Time   CHOL 159 04/26/2018 0956   TRIG 143 04/26/2018 0956   HDL 30 (L) 04/26/2018 0956   CHOLHDL 5 02/10/2011 0939   VLDL 19.8 02/10/2011 0939   LDLCALC 100 (H) 04/26/2018 0956   Outside labs:  Cholesterol, total 153.000 M 08/17/2018 HDL 32.000 M 08/17/2018 LDL 100.000 04/26/2018 Triglycerides 183.000 M 08/17/2018  A1C 9.500 % 08/17/2018 TSH 2.450 04/05/2018  Hemoglobin 17.700 11/06/2018 Platelets 214.000 11/06/2018  Creatinine, Serum 1.370 11/06/2018 Potassium 4.000 11/06/2018 Magnesium N/D ALT (SGPT) 21.000 11/06/2018  Medications   Current Outpatient Medications  Medication Instructions  . acetaminophen (TYLENOL) 650 mg, Oral, Every 6 hours PRN  . allopurinol (ZYLOPRIM) 100 MG tablet TAKE 1 TABLET BY MOUTH DAILY.  Marland Kitchen amLODipine (NORVASC) 10 MG tablet TAKE 1 TABLET BY MOUTH DAILY.  Marland Kitchen colchicine 0.6 MG tablet At onset of gout flare take 2 tablets, may take 1 tablet 1 hr later if not relieved. May do this daily up to three days.  Marland Kitchen doxazosin (CARDURA) 2 mg, Oral, Daily at bedtime  . finasteride (PROPECIA) 5 mg, Oral, Daily  . furosemide (LASIX) 40 mg, Oral, Daily  . hydrALAZINE (APRESOLINE) 25 mg, Oral, 3 times daily  . labetalol (NORMODYNE) 100 MG tablet TAKE 1 TABLET (100MG ) BY MOUTH TWICE DAILY  . losartan (COZAAR) 100 mg, Oral, Daily  . metFORMIN (GLUCOPHAGE-XR) 500 mg, Oral, 2 times daily  . pravastatin (PRAVACHOL) 20 mg, Oral, Daily at bedtime  . prednisoLONE acetate (PRED FORTE) 1 % ophthalmic suspension 1 drop, Both Eyes, 2 times daily  . XARELTO 15 MG TABS tablet TAKE 1 TABLET BY MOUTH ONCE EVERY DAY WITH SUPPER   Cardiac Studies:   Lexiscan Sestamibi stress test 04/16/2018: 1. Lexiscan stress test was performed. Exercise capacity was not assessed. No stress symptoms reported. Resting blood pressure was 150/110 mmHg and peak effect blood pressure was 110/78 mmHg. The resting and stress electrocardiogram demonstrated atrial fibrillation, normal  resting conduction, low voltage, and normal rest repolarization. Stress EKG is non diagnostic for ischemia as it is a pharmacologic stress. 2. The overall quality of the study is good. There is no evidence of abnormal lung activity. Stress and rest SPECT images demonstrate homogeneous tracer distribution throughout the myocardium. Gated SPECT imaging reveals normal myocardial thickening and wall motion. The left ventricular ejection fraction was normal (57%).  3. Low risk  study.  Outside Echocardiogram 09/06/2013: Normal LV systolic function, mild LVH, grade 1 diastolic dysfunction.  Lower extremity arterial duplex 12/01/2015: No hemodynamically significant stenoses are identified in thelower extremity arterial system. This exam reveals moderately decreased perfusion of the lower extremity, with RABI 0.76 and LABI 0.66 noted at the post tibial artery level. There is diffuse plaque and abnormal waveform in vessels below knee suggests small vessel disease.  EKG:  EKG 09/16/2019: Atrial fibrillation with controlled ventricular response at the rate of 64 bpm, left axis deviation, incomplete RBBB.  Poor R wave progression, cannot exclude anteroseptal infarct old.  Diffuse nonspecific T abnormality.  No significant change from 03/13/2019.  Assessment     ICD-10-CM   1. Permanent atrial fibrillation (HCC)  I48.21 EKG 12-Lead    PCV ECHOCARDIOGRAM COMPLETE  2. Essential hypertension  I10 hydrALAZINE (APRESOLINE) 25 MG tablet  3. Diabetes mellitus type 2 with peripheral artery disease (HCC)  E11.51   4. Stage 3a chronic kidney disease  N18.31   5. Dyspnea on exertion  R06.00 PCV ECHOCARDIOGRAM COMPLETE    Pulse oximetry, overnight  CHA2DS2-VASc Score is 4.  Yearly risk of stroke: 4.8% (A, HTN, DM).  Score of 1=0.6; 2=2.2; 3=3.2; 4=4.8; 5=7.2; 6=9.8; 7=>9.8) -(CHF; HTN; vasc disease DM,  Male = 1; Age <65 =0; 65-74 = 1,  >75 =2; stroke/embolism= 2).    Recommendations:   Dakota Scott  is a  86 y.o. peripheral arterial disease by lower extremity arterial duplex 08/25/2016, hypertension, controlled diabetes mellitus with stage 3 CKD, hyperlipidemia, permanent atrial fibrillation presents for f/u of atrial fibrillation.     He has chronic neuropathic pain in his feet, he now c/o generalized aches and pain and also generalized weakness and dyspnea on exertion.   Patient was seen in the emergency room on 08/24/2019, was found to be mildly dehydrated with slight worsening renal function and was discharged after hydration.  His mild generalized fatigue could be related to frequency of urination, he gets up at least 4-5 times every night.  I would also like to exclude nocturnal hypoxemia and will perform nocturnal oximetry.  Repeat echocardiogram.  With regard to hypertension, home recordings have been elevated, I will add hydralazine 25 mg p.o. 3 times daily.  I will see him back in 4 to 6 weeks for follow-up.   Adrian Prows, MD, [facility] 09/16/2019, 11:45 AM Office: [phone]

## 2019-09-16 NOTE — Patient Instructions (Signed)
I had a long discussion the patient and his daughter regarding his symptoms of generalized weakness for the last 3 to 4 weeks as well as numbness in his legs and gait difficulties.  He clearly has uncontrolled diabetes and underlying peripheral neuropathy which could be contributing but I recommend checking MRI scan of the brain to rule out any strokes as well as EMG nerve conduction study.  I recommend trial of Topamax 25 mg twice daily to help with his paresthesias and nonspecific and tremor.  He was advised to use a cane at all times and we discussed fall safety precautions.  He will return for follow-up in 6 weeks prior to his trip to [place] or call earlier if necessary.  Fall Prevention in the Home, Adult Falls can cause injuries. They can happen to people of all ages. There are many things you can do to make your home safe and to help prevent falls. Ask for help when making these changes, if needed. What actions can I take to prevent falls? General Instructions  Use good lighting in all rooms. Replace any light bulbs that burn out.  Turn on the lights when you go into a dark area. Use night-lights.  Keep items that you use often in easy-to-reach places. Lower the shelves around your home if necessary.  Set up your furniture so you have a clear path. Avoid moving your furniture around.  Do not have throw rugs and other things on the floor that can make you trip.  Avoid walking on wet floors.  If any of your floors are uneven, fix them.  Add color or contrast paint or tape to clearly mark and help you see: ? Any grab bars or handrails. ? First and last steps of stairways. ? Where the edge of each step is.  If you use a stepladder: ? Make sure that it is fully opened. Do not climb a closed stepladder. ? Make sure that both sides of the stepladder are locked into place. ? Ask someone to hold the stepladder for you while you use it.  If there are any pets around you, be aware of where  they are. What can I do in the bathroom?      Keep the floor dry. Clean up any water that spills onto the floor as soon as it happens.  Remove soap buildup in the tub or shower regularly.  Use non-skid mats or decals on the floor of the tub or shower.  Attach bath mats securely with double-sided, non-slip rug tape.  If you need to sit down in the shower, use a plastic, non-slip stool.  Install grab bars by the toilet and in the tub and shower. Do not use towel bars as grab bars. What can I do in the bedroom?  Make sure that you have a light by your bed that is easy to reach.  Do not use any sheets or blankets that are too big for your bed. They should not hang down onto the floor.  Have a firm chair that has side arms. You can use this for support while you get dressed. What can I do in the kitchen?  Clean up any spills right away.  If you need to reach something above you, use a strong step stool that has a grab bar.  Keep electrical cords out of the way.  Do not use floor polish or wax that makes floors slippery. If you must use wax, use non-skid floor wax. What can  I do with my stairs?  Do not leave any items on the stairs.  Make sure that you have a light switch at the top of the stairs and the bottom of the stairs. If you do not have them, ask someone to add them for you.  Make sure that there are handrails on both sides of the stairs, and use them. Fix handrails that are broken or loose. Make sure that handrails are as long as the stairways.  Install non-slip stair treads on all stairs in your home.  Avoid having throw rugs at the top or bottom of the stairs. If you do have throw rugs, attach them to the floor with carpet tape.  Choose a carpet that does not hide the edge of the steps on the stairway.  Check any carpeting to make sure that it is firmly attached to the stairs. Fix any carpet that is loose or worn. What can I do on the outside of my home?  Use  bright outdoor lighting.  Regularly fix the edges of walkways and driveways and fix any cracks.  Remove anything that might make you trip as you walk through a door, such as a raised step or threshold.  Trim any bushes or trees on the path to your home.  Regularly check to see if handrails are loose or broken. Make sure that both sides of any steps have handrails.  Install guardrails along the edges of any raised decks and porches.  Clear walking paths of anything that might make someone trip, such as tools or rocks.  Have any leaves, snow, or ice cleared regularly.  Use sand or salt on walking paths during winter.  Clean up any spills in your garage right away. This includes grease or oil spills. What other actions can I take?  Wear shoes that: ? Have a low heel. Do not wear high heels. ? Have rubber bottoms. ? Are comfortable and fit you well. ? Are closed at the toe. Do not wear open-toe sandals.  Use tools that help you move around (mobility aids) if they are needed. These include: ? Canes. ? Walkers. ? Scooters. ? Crutches.  Review your medicines with your doctor. Some medicines can make you feel dizzy. This can increase your chance of falling. Ask your doctor what other things you can do to help prevent falls. Where to find more information  Centers for Disease Control and Prevention, STEADI: https://garcia.biz/  [employer] on Aging: BrainJudge.co.uk Contact a doctor if:  You are afraid of falling at home.  You feel weak, drowsy, or dizzy at home.  You fall at home. Summary  There are many simple things that you can do to make your home safe and to help prevent falls.  Ways to make your home safe include removing tripping hazards and installing grab bars in the bathroom.  Ask for help when making these changes in your home. This information is not intended to replace advice given to you by your health care provider. Make sure you discuss any  questions you have with your health care provider. Document Revised: 05/31/2018 Document Reviewed: 09/22/2016 Elsevier Patient Education  2020 [employer].

## 2019-09-16 NOTE — Progress Notes (Signed)
[facility] [address] [place]. [place] [address] [phone]       OFFICE CONSULT NOTE  Dakota Scott Date of Birth:  21-Mar-1933 Medical Record Number:  [id number]   Referring MD: Self-referral Reason for Referral: Generalized weakness and numbness  HPI: Dakota Scott is 86 year old African male originally from [place] in [place] who is accompanied today by his daughter.  History is obtained from them, review of electronic medical records and I personally reviewed imaging films in PACS.  He has past medical history of hypertension, diabetes, stage III kidney disease, hyperlipidemia, permanent atrial fibrillation, peripheral arterial disease who states for the last 3 to 4 weeks he is been complaining of generalized weakness, numbness in his feet and gait and balance difficulties.  He was seen in the emergency room on 08/24/2019 when he presented with the symptoms and was found to be mildly dehydrated with elevated serum creatinine of 1.7 and discharge after receiving IV fluids.  Patient states that he felt slightly better but still has persistent numbness in his feet which is bothersome as well as gait imbalance and has been using a cane though he has had no falls.  He remains on Xarelto for atrial fibrillation and states he is tolerating it well without bruising or bleeding.  He did undergo a CT scan of the head on 09/13/2019 which shows mild generalized atrophy without acute abnormalities.  He was seen by me in 2015 for specific hand tremor related to writing which was quite mild and no specific treatment was offered at that time.  He states his tremors have remained basically unchanged they are not gotten worse and they are not bothersome except when he is trying to sign right something.  He was recently seen by cardiologist Dr. Einar Gip today who is ordered an echocardiogram to be done next week.  He states his sugars are not well controlled and usually range between  100-300.  He denies any headaches, slurred speech, focal extremity weakness.  He has   tried   gabapentin 300 mg 3 times daily as needed for neuropathic pain but not found to be quite effective. ROS:   14 system review of systems is positive for numbness, gait ataxia, imbalance, generalized weakness and all other systems negative  PMH:  Past Medical History:  Diagnosis Date  . Acute hepatitis C without mention of hepatic coma(070.51)   . Atrial fibrillation ([place]) 09/07/2018  . Claudication, intermittent ([place]) 04/25/2018  . Diabetes mellitus without complication ([place])   . Gout   . Hyperlipidemia   . Hypertension   . Prostate cancer [facility])     Social History:  Social History   Socioeconomic History  . Marital status: Married    Spouse name: Not on file  . Number of children: 7  . Years of education: Not on file  . Highest education level: Not on file  Occupational History  . Not on file  Tobacco Use  . Smoking status: Never Smoker  . Smokeless tobacco: Never Used  Vaping Use  . Vaping Use: Never used  Substance and Sexual Activity  . Alcohol use: Not Currently  . Drug use: Never  . Sexual activity: Not Currently  Other Topics Concern  . 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 Difficulty of Paying Living Expenses:   Food Insecurity:   . Worried About Charity fundraiser in the Last Year:   . Arboriculturist in the Last Year:   Transportation Needs:   . Film/video editor (Medical):   Marland Kitchen Lack of Transportation (Non-Medical):   Physical Activity:   . Days of Exercise per Week:   . Minutes of Exercise per Session:   Stress:   . Feeling of Stress :   Social Connections:   . Frequency of Communication with  Friends and Family:   . Frequency of Social Gatherings with Friends and Family:   . Attends Religious Services:   . Active Member of Clubs or Organizations:   . Attends Archivist Meetings:   Marland Kitchen Marital Status:   Intimate Partner Violence:   . Fear of Current or Ex-Partner:   . Emotionally Abused:   Marland Kitchen Physically Abused:   . Sexually Abused:     Medications:   Current Outpatient Medications on File Prior to Visit  Medication Sig Dispense Refill  . allopurinol (ZYLOPRIM) 100 MG tablet TAKE 1 TABLET BY MOUTH DAILY. (Patient taking differently: Take 100 mg by mouth daily. ) 90 tablet 3  . amLODipine (NORVASC) 10 MG tablet TAKE 1 TABLET BY MOUTH DAILY. (Patient taking differently: Take 5 mg by mouth daily. ) 90 tablet 3  . colchicine 0.6 MG tablet At onset of gout flare take 2 tablets, may take 1 tablet 1 hr later if not relieved. May do this daily up to three days. (Patient taking differently: Take 0.6 mg by mouth daily. At onset of gout flare take 2 tablets, may take 1 tablet 1 hr later if not relieved. May do this daily up to three days.) 30 tablet 1  . doxazosin (CARDURA) 2 MG tablet Take 1 tablet (2 mg total) by mouth at bedtime. (Patient taking differently: Take 4 mg by mouth daily. ) 30 tablet 0  . finasteride (PROPECIA) 1 MG tablet Take 5 mg by mouth daily.     . furosemide (LASIX) 20 MG tablet Take 2 tablets (40 mg total) by mouth daily. (Patient taking differently: Take 20 mg by mouth 2 (two) times daily. ) 90 tablet 1  . labetalol (NORMODYNE) 100 MG tablet TAKE 1 TABLET (100MG ) BY MOUTH TWICE DAILY (Patient taking differently: Take 200 mg by mouth 2 (two) times daily. ) 180 tablet 3  . losartan (COZAAR) 100 MG tablet Take 100 mg by mouth daily.    . metFORMIN (GLUCOPHAGE-XR) 500 MG 24 hr tablet Take 500 mg by mouth 2 (two) times daily.     . pravastatin (PRAVACHOL) 20 MG tablet Take 20 mg by mouth at bedtime.     . prednisoLONE acetate (PRED FORTE) 1 % ophthalmic suspension  Place 1 drop into both eyes 2 (two) times daily.    Alveda Reasons 15 MG TABS tablet TAKE 1 TABLET BY MOUTH ONCE EVERY DAY WITH SUPPER 30 tablet 3   No current facility-administered medications on file prior to visit.    Allergies:  No Known Allergies  Physical Exam General: Mildly obese elderly African male, seated, in no evident distress Head: head normocephalic and atraumatic.   Neck: supple with no carotid or supraclavicular bruits Cardiovascular: regular rate and rhythm, no murmurs Musculoskeletal: no deformity Skin:  no rash/petichiae Vascular:  Normal pulses all extremities  Neurologic Exam  Mental Status: Awake and fully alert. Oriented to place and time. Recent and remote memory intact. Attention span, concentration and fund of knowledge appropriate. Mood and affect appropriate.  Cranial Nerves: Fundoscopic exam reveals sharp disc margins. Pupils equal, briskly reactive to light. Extraocular movements full without nystagmus. Visual fields full to confrontation. Hearing intact. Facial sensation intact. Face, tongue, palate moves normally and symmetrically.  Motor: Normal bulk and tone. Normal strength in all tested extremity muscles. Sensory.: intact to touch , pinprick , position  sensation.  But diminished vibration sensation over toes bilaterally. Coordination: Rapid alternating movements normal in all extremities. Finger-to-nose and heel-to-shin performed accurately bilaterally. Gait and Station: Arises from chair without difficulty. Stance is slightly broad-based. Gait demonstrates mild imbalance and uses a cane.  Unable to heel, toe and tandem walk without difficulty.  Reflexes: 1+ and symmetric. Toes downgoing.       ASSESSMENT: 86 year old African male with 3 to 4 weeks history of generalized weakness, gait ataxia and feet numbness of unclear etiology.  He certainly has a mild diabetic peripheral neuropathy which may be contributing. He also has mild specific hand tremor which  has been stable over the years and is nondisabling   PLAN: I had a long discussion the patient and his daughter regarding his symptoms of generalized weakness for the last 3 to 4 weeks as well as numbness in his legs and gait difficulties.  He clearly has uncontrolled diabetes and underlying peripheral neuropathy which could be contributing but I recommend checking MRI scan of the brain to rule out any strokes as well as EMG nerve conduction study.  I recommend trial of Topamax 25 mg twice daily to help with his paresthesias and nonspecific and tremor.  He was advised to use a cane at all times and we discussed fall safety precautions.  Greater than 50% time during this 45-minute consultation visit was spent on counseling and coordination of care about his generalized weakness, numbness and gait difficulties and answering questions.  He will return for follow-up in 6 weeks prior to his trip to [place] or call earlier if necessary. Antony Contras, MD  [facility] [address] [place],  [address]  Phone [phone] Fax [phone] Note: This document was prepared with digital dictation and possible smart phrase technology. Any transcriptional errors that result from this process are unintentional.

## 2019-09-19 ENCOUNTER — Telehealth: Payer: Self-pay | Admitting: Neurology

## 2019-09-19 ENCOUNTER — Ambulatory Visit: Payer: Medicare PPO

## 2019-09-19 ENCOUNTER — Other Ambulatory Visit: Payer: Self-pay

## 2019-09-19 DIAGNOSIS — I4821 Permanent atrial fibrillation: Secondary | ICD-10-CM | POA: Diagnosis not present

## 2019-09-19 DIAGNOSIS — R0609 Other forms of dyspnea: Secondary | ICD-10-CM | POA: Diagnosis not present

## 2019-09-19 NOTE — Telephone Encounter (Signed)
Mcarthur Rossetti Josem Kaufmann: [id number] (exp. 09/19/19 to 10/19/19) order sent to GI. They will reach out to the patient to schedule.

## 2019-10-01 DIAGNOSIS — H401232 Low-tension glaucoma, bilateral, moderate stage: Secondary | ICD-10-CM | POA: Diagnosis not present

## 2019-10-02 ENCOUNTER — Other Ambulatory Visit: Payer: Self-pay

## 2019-10-02 ENCOUNTER — Ambulatory Visit
Admission: RE | Admit: 2019-10-02 | Discharge: 2019-10-02 | Disposition: A | Discharge status: Home / Self Care | Payer: Medicare PPO | Source: Ambulatory Visit | Attending: Neurology | Admitting: Neurology

## 2019-10-02 DIAGNOSIS — R531 Weakness: Secondary | ICD-10-CM | POA: Diagnosis not present

## 2019-10-02 DIAGNOSIS — R202 Paresthesia of skin: Secondary | ICD-10-CM | POA: Diagnosis not present

## 2019-10-11 NOTE — Progress Notes (Signed)
Kindly inform the patient that MRI scan of the brain shows moderate changes of hardening of the arteries and shrinkage of the brain.  There are a few old remote age strokes in the deep portions of the brain on both sides.  Compared with previous MRI from 2015 there is expected age-related slight worsening of changes of hardening of the arteries.  Not a new or unexpected finding.  Nothing to worry about

## 2019-10-14 ENCOUNTER — Telehealth: Payer: Self-pay | Admitting: *Deleted

## 2019-10-14 NOTE — Telephone Encounter (Signed)
Daughter called back, I informed  MRI scan of the brain shows moderate changes of hardening of the arteries and shrinkage of the brain. There are a few old remote age strokes in the deep portions of the brain on both sides. Compared with previous MRI from 2015 there is expected age-related slight worsening of changes of hardening of the arteries. Not a new or unexpected finding. Nothing to worry about. She verbalized understanding, appreciation.

## 2019-10-14 NOTE — Telephone Encounter (Signed)
LVM for daughter Lenell Antu requesting call back.

## 2019-10-15 ENCOUNTER — Encounter: Payer: Medicare PPO | Admitting: Neurology

## 2019-10-15 ENCOUNTER — Other Ambulatory Visit: Payer: Self-pay | Admitting: Cardiology

## 2019-10-15 DIAGNOSIS — I4821 Permanent atrial fibrillation: Secondary | ICD-10-CM

## 2019-10-30 NOTE — Telephone Encounter (Signed)
Called pt back to schedule appt, If pt or daughter calls back please schedule fu with Dr. Leonie Man

## 2019-10-31 ENCOUNTER — Encounter: Payer: Self-pay | Admitting: Cardiology

## 2019-10-31 ENCOUNTER — Other Ambulatory Visit: Payer: Self-pay

## 2019-10-31 ENCOUNTER — Ambulatory Visit: Payer: Medicare PPO | Admitting: Cardiology

## 2019-10-31 VITALS — BP 138/84 | HR 72 | Resp 16 | Ht 71.0 in | Wt 189.0 lb

## 2019-10-31 DIAGNOSIS — R600 Localized edema: Secondary | ICD-10-CM | POA: Diagnosis not present

## 2019-10-31 DIAGNOSIS — I10 Essential (primary) hypertension: Secondary | ICD-10-CM | POA: Diagnosis not present

## 2019-10-31 DIAGNOSIS — N1831 Chronic kidney disease, stage 3a: Secondary | ICD-10-CM

## 2019-10-31 DIAGNOSIS — E1151 Type 2 diabetes mellitus with diabetic peripheral angiopathy without gangrene: Secondary | ICD-10-CM

## 2019-10-31 DIAGNOSIS — I4821 Permanent atrial fibrillation: Secondary | ICD-10-CM

## 2019-10-31 MED ORDER — AMLODIPINE BESYLATE 5 MG PO TABS: 5.0000 mg | ORAL_TABLET | Freq: Every day | ORAL | 3 refills | Status: DC

## 2019-10-31 MED ORDER — LABETALOL HCL 200 MG PO TABS: 200.0000 mg | ORAL_TABLET | Freq: Two times a day (BID) | ORAL | 3 refills | Status: DC

## 2019-10-31 MED ORDER — HYDRALAZINE HCL 25 MG PO TABS: 25.0000 mg | ORAL_TABLET | Freq: Three times a day (TID) | ORAL | 2 refills | Status: DC

## 2019-10-31 MED ORDER — LOSARTAN POTASSIUM 100 MG PO TABS: 100.0000 mg | ORAL_TABLET | Freq: Every day | ORAL | 3 refills | Status: DC

## 2019-10-31 MED ORDER — RIVAROXABAN 15 MG PO TABS: ORAL_TABLET | ORAL | 3 refills | Status: DC

## 2019-10-31 MED ORDER — FUROSEMIDE 20 MG PO TABS: 20.0000 mg | ORAL_TABLET | Freq: Every day | ORAL | 1 refills | Status: DC

## 2019-10-31 NOTE — Progress Notes (Signed)
Primary Physician/Referring:  Benito Mccreedy, MD  Patient ID: Dakota Scott, male    DOB: 26-Jan-1934, 86 y.o.   MRN: [id number]  Chief Complaint  Patient presents with  . Persistant Atrial Fibrillation  . Hypertension  . Shortness of Breath  . Follow-up    6 week   HPI:    HPI: Dakota Scott  is a 86 y.o. male  peripheral arterial disease by lower extremity arterial duplex 08/25/2016, hypertension, controlled diabetes mellitus with stage 3 CKD, hyperlipidemia, permanent atrial fibrillation presents for f/u of atrial fibrillation.  He has chronic neuropathic pain in his feet.   Patient was seen in the emergency room on 08/24/2019 when he presented with generalized weakness, dizziness, was found to be mildly dehydrated and also in acute renal failure with serum creatinine of 1.7, received IV fluids and discharged home.  Patient presents for 6-week follow-up for hypertension, dyspnea and nocturnal oximetry.  At last visit started patient on hydralazine 25 mg 3 times daily.  Also at last visit ordered nocturnal oximetry to evaluate patient's fatigue and weakness, however patient has not had this done.  He reports he monitors his blood pressure at home and gets numbers averaging 140/80.  Continues to complain of chronic stable fatigue and dyspnea on exertion.  Also continues to have foot and lower leg pain while walking, worse on the right than the left.  Denies chest pain, palpitations, leg swelling, orthopnea, syncope.  Has no bleeding diathesis, on long-term anticoagulation.   Of note patient reports that he is leaving on September 17 to go to the [place] to visit family for 3 months.   Past Medical History:  Diagnosis Date  . Acute hepatitis C without mention of hepatic coma(070.51)   . Atrial fibrillation ([place]) 09/07/2018  . Claudication, intermittent ([place]) 04/25/2018  . Diabetes mellitus without complication ([place])   . Gout   . Hyperlipidemia   . Hypertension   . Prostate cancer  [facility])    Past Surgical History:  Procedure Laterality Date  . EYE SURGERY  2009   cataract   Social History   Tobacco Use  . Smoking status: Never Smoker  . Smokeless tobacco: Never Used  Substance Use Topics  . Alcohol use: Not Currently  Marital Status: Married   ROS  Review of Systems  Constitutional: Positive for malaise/fatigue.  Cardiovascular: Positive for dyspnea on exertion. Negative for chest pain, claudication, leg swelling, orthopnea, palpitations and syncope.  Hematologic/Lymphatic: Does not bruise/bleed easily.  Musculoskeletal: Positive for arthritis and back pain.  Gastrointestinal: Negative for melena.  Genitourinary: Positive for frequency.   Objective  Blood pressure 138/84, pulse 72, resp. rate 16, height 5\' 11"  (1.803 m), weight 189 lb (85.7 kg), SpO2 98 %. Body mass index is 26.36 kg/m.   Physical Exam Constitutional:      General: He is not in acute distress.    Appearance: He is well-developed.     Comments: Mildly obese  Neck:     Thyroid: No thyromegaly.     Vascular: No JVD.  Cardiovascular:     Rate and Rhythm: Normal rate. Rhythm irregular.     Pulses:          Carotid pulses are 2+ on the right side and 2+ on the left side.      Radial pulses are 2+ on the right side and 2+ on the left side.       Dorsalis pedis pulses are 1+ on the right side and 1+ on  the left side.       Posterior tibial pulses are 1+ on the right side and 1+ on the left side.     Heart sounds: S1 normal and S2 normal. No murmur heard.  No gallop.      Comments: No leg edema.  Pulmonary:     Effort: Pulmonary effort is normal.     Breath sounds: Normal breath sounds. No wheezing, rhonchi or rales.  Musculoskeletal:     Right lower leg: No edema.     Left lower leg: No edema.    Radiology: No results found.  Laboratory examination:    CMP Latest Ref Rng & Units 08/24/2019 11/06/2018 04/26/2018  Glucose 70 - 99 mg/dL 259(H) 167(H) 204(H)  BUN 8 - 23 mg/dL 31(H)  22 24  Creatinine 0.61 - 1.24 mg/dL 1.70(H) 1.37(H) 1.35(H)  Sodium 135 - 145 mmol/L 139 137 140  Potassium 3.5 - 5.1 mmol/L 4.2 4.0 3.9  Chloride 98 - 111 mmol/L 101 97(L) 98  CO2 22 - 32 mmol/L 28 25 28   Calcium 8.9 - 10.3 mg/dL 9.4 9.5 9.4  Total Protein 6.5 - 8.1 g/dL 8.1 7.9 -  Total Bilirubin 0.3 - 1.2 mg/dL 0.9 1.3(H) -  Alkaline Phos 38 - 126 U/L 72 85 -  AST 15 - 41 U/L 22 27 -  ALT 0 - 44 U/L 18 21 -   CBC Latest Ref Rng & Units 08/24/2019 11/06/2018 02/10/2011  WBC 4.0 - 10.5 K/uL 5.3 9.2 4.7  Hemoglobin 13.0 - 17.0 g/dL 15.2 17.7(H) 14.9  Hematocrit 39 - 52 % 44.8 50.0 43.5  Platelets 150 - 400 K/uL 211 214 207.0   Lipid Panel     Component Value Date/Time   CHOL 159 04/26/2018 0956   TRIG 143 04/26/2018 0956   HDL 30 (L) 04/26/2018 0956   CHOLHDL 5 02/10/2011 0939   VLDL 19.8 02/10/2011 0939   LDLCALC 100 (H) 04/26/2018 0956   Outside labs:  08/13/2019: HDL 28, triglycerides 199, total 141 A1c 7.3%  Cholesterol, total 153.000 M 08/17/2018 HDL 32.000 M 08/17/2018 LDL 100.000 04/26/2018 Triglycerides 183.000 M 08/17/2018  A1C 9.500 % 08/17/2018 TSH 2.450 04/05/2018  Hemoglobin 17.700 11/06/2018 Platelets 214.000 11/06/2018  Creatinine, Serum 1.370 11/06/2018 Potassium 4.000 11/06/2018 Magnesium N/D ALT (SGPT) 21.000 11/06/2018  Medications   Current Outpatient Medications  Medication Instructions  . acetaminophen (TYLENOL) 650 mg, Oral, Every 6 hours PRN  . allopurinol (ZYLOPRIM) 100 MG tablet TAKE 1 TABLET BY MOUTH DAILY.  Marland Kitchen amLODipine (NORVASC) 5 mg, Oral, Daily  . colchicine 0.6 MG tablet At onset of gout flare take 2 tablets, may take 1 tablet 1 hr later if not relieved. May do this daily up to three days.  Marland Kitchen doxazosin (CARDURA) 2 mg, Oral, Daily at bedtime  . finasteride (PROPECIA) 5 mg, Oral, Daily  . furosemide (LASIX) 20 mg, Oral, Daily  . hydrALAZINE (APRESOLINE) 25 mg, Oral, 3 times daily  . labetalol (NORMODYNE) 200 mg, Oral, 2 times daily  .  losartan (COZAAR) 100 mg, Oral, Daily  . metFORMIN (GLUCOPHAGE-XR) 500 mg, Oral, 2 times daily  . pravastatin (PRAVACHOL) 20 mg, Oral, Daily at bedtime  . prednisoLONE acetate (PRED FORTE) 1 % ophthalmic suspension 1 drop, Both Eyes, 2 times daily  . Rivaroxaban (XARELTO) 15 MG TABS tablet TAKE 1 TABLET BY MOUTH ONCE EVERY DAY WITH SUPPER  . topiramate (TOPAMAX) 25 mg, Oral, 2 times daily   Cardiac Studies:   Echocardiogram 09/19/2019:  Hyperdynamic LV systolic  function with visual EF >70%. Left ventricle cavity is normal in size. Severe left ventricular hypertrophy. Normal global wall motion. Unable to evaluate diastolic function due to atrial fibrillation. Elevated LAP. Calculated EF 50%.  Left atrial cavity is mildly dilated.  Right atrial cavity is mildly dilated.  Mild (Grade I) mitral regurgitation.  Moderate tricuspid regurgitation. Mild pulmonary hypertension. RVSP measures 37 mmHg.  IVC is dilated with respiratory variation.  No prior study for comparison.  Lexiscan Sestamibi stress test 04/16/2018: 1. Lexiscan stress test was performed. Exercise capacity was not assessed. No stress symptoms reported. Resting blood pressure was 150/110 mmHg and peak effect blood pressure was 110/78 mmHg. The resting and stress electrocardiogram demonstrated atrial fibrillation, normal resting conduction, low voltage, and normal rest repolarization. Stress EKG is non diagnostic for ischemia as it is a pharmacologic stress. 2. The overall quality of the study is good. There is no evidence of abnormal lung activity. Stress and rest SPECT images demonstrate homogeneous tracer distribution throughout the myocardium. Gated SPECT imaging reveals normal myocardial thickening and wall motion. The left ventricular ejection fraction was normal (57%).  3. Low risk study.  Outside Echocardiogram 09/06/2013: Normal LV systolic function, mild LVH, grade 1 diastolic dysfunction.  Lower extremity arterial duplex  12/01/2015: No hemodynamically significant stenoses are identified in thelower extremity arterial system. This exam reveals moderately decreased perfusion of the lower extremity, with RABI 0.76 and LABI 0.66 noted at the post tibial artery level. There is diffuse plaque and abnormal waveform in vessels below knee suggests small vessel disease.  EKG: EKG 09/16/2019: Atrial fibrillation with controlled ventricular response at the rate of 64 bpm, left axis deviation, incomplete RBBB.  Poor R wave progression, cannot exclude anteroseptal infarct old.  Diffuse nonspecific T abnormality.  No significant change from 03/13/2019.  Assessment   Meds ordered this encounter  Medications  . amLODipine (NORVASC) 5 MG tablet    Sig: Take 1 tablet (5 mg total) by mouth daily.    Dispense:  90 tablet    Refill:  3  . hydrALAZINE (APRESOLINE) 25 MG tablet    Sig: Take 1 tablet (25 mg total) by mouth 3 (three) times daily.    Dispense:  90 tablet    Refill:  2  . labetalol (NORMODYNE) 200 MG tablet    Sig: Take 1 tablet (200 mg total) by mouth 2 (two) times daily.    Dispense:  90 tablet    Refill:  3  . furosemide (LASIX) 20 MG tablet    Sig: Take 1 tablet (20 mg total) by mouth daily.    Dispense:  90 tablet    Refill:  1  . Rivaroxaban (XARELTO) 15 MG TABS tablet    Sig: TAKE 1 TABLET BY MOUTH ONCE EVERY DAY WITH SUPPER    Dispense:  90 tablet    Refill:  3    * * N O T I C E * * Last quantity doesn't match original quantity  . losartan (COZAAR) 100 MG tablet    Sig: Take 1 tablet (100 mg total) by mouth daily.    Dispense:  90 tablet    Refill:  3     ICD-10-CM   1. Permanent atrial fibrillation (HCC)  I48.21 amLODipine (NORVASC) 5 MG tablet    labetalol (NORMODYNE) 200 MG tablet    Rivaroxaban (XARELTO) 15 MG TABS tablet  2. Essential hypertension  I10 amLODipine (NORVASC) 5 MG tablet    hydrALAZINE (APRESOLINE) 25 MG tablet  labetalol (NORMODYNE) 200 MG tablet    furosemide (LASIX) 20  MG tablet    losartan (COZAAR) 100 MG tablet  3. Diabetes mellitus type 2 with peripheral artery disease (HCC)  E11.51 losartan (COZAAR) 100 MG tablet  4. Stage 3a chronic kidney disease  N18.31   5. Leg edema  R60.0 furosemide (LASIX) 20 MG tablet   CHA2DS2-VASc Score is 4.  Yearly risk of stroke: 4.8% (A, HTN, DM).  Score of 1=0.6; 2=2.2; 3=3.2; 4=4.8; 5=7.2; 6=9.8; 7=>9.8) -(CHF; HTN; vasc disease DM,  Male = 1; Age <65 =0; 65-74 = 1,  >75 =2; stroke/embolism= 2).    Recommendations:   Dakota Scott  is a 86 y.o. peripheral arterial disease by lower extremity arterial duplex 08/25/2016, hypertension, controlled diabetes mellitus with stage 3 CKD, hyperlipidemia, permanent atrial fibrillation presents for f/u of atrial fibrillation.     Presents for 6-week follow-up on hypertension, dyspnea.  Discussed echocardiogram results with patient informed of hyperdynamic left ventricle and diastolic dysfunction. This is likely contributing to patient's dyspnea on exertion and fatigue. In the future would consider stopping amlodipine and switching to diltiazem 120mg  to better control heart rate. However as patient is presently stable and he is going to be traveling [place] 3 [place], will not make any medication changes at this time.   Suspect nocturnal hypoxemia may also be contributing to symptoms, will set patient up for nocturnal oximetry to further evaluate.  In regard to hypertension, blood pressure still elevated today however will hold off on making medication changes since patient will be traveling.  Leg pain while walking is likely related to neuropathy.    In regard to patient's permanent A. fib, will continue Xarelto as CHA2DS2-VASc score is 4.  Refilled antihypertensive medications as well as Xarelto.  Follow-up in 6 months for hypertension, permanent atrial fibrillation, pulse oximetry results.   Patient was seen in collaboration with Dr. Einar Gip. He also reviewed patient's chart and  examined the patient. Dr. Einar Gip is in agreement of the plan.    Alethia Berthold, PA-C 10/31/2019, 2:10 PM Office: [phone]

## 2019-11-01 DIAGNOSIS — I129 Hypertensive chronic kidney disease with stage 1 through stage 4 chronic kidney disease, or unspecified chronic kidney disease: Secondary | ICD-10-CM | POA: Diagnosis not present

## 2019-11-01 DIAGNOSIS — N182 Chronic kidney disease, stage 2 (mild): Secondary | ICD-10-CM | POA: Diagnosis not present

## 2019-11-01 DIAGNOSIS — M109 Gout, unspecified: Secondary | ICD-10-CM | POA: Diagnosis not present

## 2019-11-01 DIAGNOSIS — N400 Enlarged prostate without lower urinary tract symptoms: Secondary | ICD-10-CM | POA: Diagnosis not present

## 2019-11-01 DIAGNOSIS — R7989 Other specified abnormal findings of blood chemistry: Secondary | ICD-10-CM | POA: Diagnosis not present

## 2019-11-01 DIAGNOSIS — N189 Chronic kidney disease, unspecified: Secondary | ICD-10-CM | POA: Diagnosis not present

## 2019-11-01 DIAGNOSIS — Z6829 Body mass index (BMI) 29.0-29.9, adult: Secondary | ICD-10-CM | POA: Diagnosis not present

## 2019-11-01 NOTE — Telephone Encounter (Signed)
Daughter called back and stated that if there are no openings for next week the pt will have to be seen next year do to traveling till the end of December. Nothing was available before the pt leaves so daughter scheduled for Jan. 2022

## 2019-11-05 DIAGNOSIS — Z03818 Encounter for observation for suspected exposure to other biological agents ruled out: Secondary | ICD-10-CM | POA: Diagnosis not present

## 2019-12-10 ENCOUNTER — Encounter: Payer: Medicare PPO | Admitting: Neurology

## 2019-12-18 ENCOUNTER — Telehealth: Payer: Self-pay | Admitting: Cardiology

## 2019-12-19 NOTE — Telephone Encounter (Signed)
Complete

## 2020-02-03 ENCOUNTER — Other Ambulatory Visit: Payer: Self-pay | Admitting: Student

## 2020-02-03 DIAGNOSIS — I10 Essential (primary) hypertension: Secondary | ICD-10-CM

## 2020-03-12 ENCOUNTER — Ambulatory Visit: Payer: Medicare PPO | Admitting: Cardiology

## 2020-03-16 ENCOUNTER — Ambulatory Visit (INDEPENDENT_AMBULATORY_CARE_PROVIDER_SITE_OTHER): Payer: Medicare PPO | Admitting: Neurology

## 2020-03-16 ENCOUNTER — Encounter: Payer: Self-pay | Admitting: Neurology

## 2020-03-16 VITALS — BP 121/73 | HR 78 | Ht 66.0 in | Wt 184.6 lb

## 2020-03-16 DIAGNOSIS — R202 Paresthesia of skin: Secondary | ICD-10-CM

## 2020-03-16 DIAGNOSIS — E0842 Diabetes mellitus due to underlying condition with diabetic polyneuropathy: Secondary | ICD-10-CM

## 2020-03-16 NOTE — Patient Instructions (Signed)
I had a long discussion with the patient and his daughter regarding his lower extremity paresthesias and gait and balance difficulties which likely due to underlying diabetic neuropathy.  I recommend he continue Topamax and the current dose of 25 mg twice daily which seems to be helping his paresthesias and also advised him to get up slowly and avoid sudden movements and we discussed fall safety precautions.  I encouraged him to keep his appointment for EMG nerve conduction study on February 15 as relates to neuropathy panel labs today.  Will return for follow-up in the future in 6 months or call earlier if necessary.  Diabetic Neuropathy Diabetic neuropathy refers to nerve damage that is caused by diabetes. Over time, people with diabetes can develop nerve damage throughout the body. There are several types of diabetic neuropathy:  Peripheral neuropathy. This is the most common type of diabetic neuropathy. It damages the nerves that carry signals between the spinal cord and other parts of the body (peripheral nerves). This usually affects nerves in the feet, legs, hands, and arms.  Autonomic neuropathy. This type causes damage to nerves that control involuntary functions (autonomic nerves). Involuntary functions are functions of the body that you do not control. They include heartbeat, body temperature, blood pressure, urination, digestion, sweating, sexual function, or response to changes in blood glucose.  Focal neuropathy. This type of nerve damage affects one area of the body, such as an arm, a leg, or the face. The injury may involve one nerve or a small group of nerves. Focal neuropathy can be painful and unpredictable. It occurs most often in older adults with diabetes. This often develops suddenly, but usually improves over time and does not cause long-term problems.  Proximal neuropathy. This type of nerve damage affects the nerves of the thighs, hips, buttocks, or legs. It causes severe pain,  weakness, and muscle death (atrophy), usually in the thigh muscles. It is more common among older men and people who have type 2 diabetes. The length of recovery time may vary. What are the causes? Peripheral, autonomic, and focal neuropathies are caused by diabetes that is not well controlled with treatment. The cause of proximal neuropathy is not known, but it may be caused by inflammation related to uncontrolled blood glucose levels. What are the signs or symptoms? Peripheral neuropathy Peripheral neuropathy develops slowly over time. When the nerves of the feet and legs no longer work, you may experience:  Burning, stabbing, or aching pain in the legs or feet.  Pain or cramping in the legs or feet.  Loss of feeling (numbness) and inability to feel pressure or pain in the feet. This can lead to: ? Thick calluses or sores on areas of constant pressure. ? Ulcers. ? Reduced ability to feel temperature changes.  Foot deformities.  Muscle weakness.  Loss of balance or coordination. Autonomic neuropathy The symptoms of autonomic neuropathy vary depending on which nerves are affected. Symptoms may include:  Problems with digestion, such as: ? Nausea or vomiting. ? Poor appetite. ? Bloating. ? Diarrhea or constipation. ? Trouble swallowing. ? Losing weight without trying to.  Problems with the heart, blood, and lungs, such as: ? Dizziness, especially when standing up. ? Fainting. ? Shortness of breath. ? Irregular heartbeat.  Bladder problems, such as: ? Trouble starting or stopping urination. ? Leaking urine. ? Trouble emptying the bladder. ? Urinary tract infections (UTIs).  Problems with other body functions, such as: ? Sweat. You may sweat too much or too little. ?  Temperature. You might get hot easily. Or, you might feel cold more than usual. ? Sexual function. Men may not be able to get or maintain an erection. Women may have vaginal dryness and difficulty with  arousal. Focal neuropathy Symptoms affect only one area of the body. Common symptoms include:  Numbness.  Tingling.  Burning pain.  Prickling feeling.  Very sensitive skin.  Weakness.  Inability to move (paralysis).  Muscle twitching.  Muscles getting smaller (wasting).  Poor coordination.  Double or blurred vision. Proximal neuropathy  Sudden, severe pain in the hip, thigh, or buttocks. Pain may spread from the back into the legs (sciatica).  Pain and numbness in the arms and legs.  Tingling.  Loss of bladder control or bowel control.  Weakness and wasting of thigh muscles.  Difficulty getting up from a seated position.  Abdominal swelling.  Unexplained weight loss. How is this diagnosed? Diagnosis varies depending on the type of neuropathy your health care provider suspects. Peripheral neuropathy Your health care provider will do a neurologic exam. This exam checks your reflexes, how you move, and what you can feel. You may have other tests, such as:  Blood tests.  Tests of the fluid that surrounds the spinal cord (lumbar puncture).  CT scan.  MRI.  Checking the nerves that control muscles (electromyogram, or EMG).  Checking how quickly signals pass through your nerves (nerve conduction study).  Checking a small piece of a nerve using a microscope (biopsy). Autonomic neuropathy You may have tests, such as:  Tests to measure your blood pressure and heart rate. You may be secured to an exam table that moves you from a lying position to an upright position (table tilt test).  Breathing tests to check your lungs.  Tests to check how food moves through the digestive system (gastric emptying tests).  Blood, sweat, or urine tests.  Ultrasound of your bladder.  Spinal fluid tests. Focal neuropathy This condition may be diagnosed with:  A neurologic exam.  CT scan.  MRI.  EMG.  Nerve conduction study. Proximal neuropathy There is no test  to diagnose this type of neuropathy. You may have tests to rule out other possible causes of this type of neuropathy. Tests may include:  X-rays of your spine and lumbar region.  Lumbar puncture.  MRI. How is this treated? The goal of treatment is to keep nerve damage from getting worse. Treatment may include:  Following your diabetes management plan. This will help keep your blood glucose level and your A1C level within your target range. This is the most important treatment.  Using prescription pain medicine. Follow these instructions at home: Diabetes management Follow your diabetes management plan as told by your health care provider.  Check your blood glucose levels.  Keep your blood glucose in your target range.  Have your A1C level checked at least two times a year, or as often as told.  Take over the counter and prescription medicines only as told by your health care provider. This includes insulin and diabetes medicine.   Lifestyle  Do not use any products that contain nicotine or tobacco, such as cigarettes, e-cigarettes, and chewing tobacco. If you need help quitting, ask your health care provider.  Be physically active every day. Include strength training and balance exercises.  Follow a healthy meal plan.  Work with your health care provider to manage your blood pressure.   General instructions  Ask your health care provider if the medicine prescribed to you requires you  to avoid driving or using machinery.  Check your skin and feet every day for cuts, bruises, redness, blisters, or sores.  Keep all follow-up visits. This is important. Contact a health care provider if:  You have burning, stabbing, or aching pain in your legs or feet.  You are unable to feel pressure or pain in your feet.  You develop problems with digestion, such as: ? Nausea. ? Vomiting. ? Bloating. ? Constipation. ? Diarrhea. ? Abdominal pain.  You have difficulty with urination,  such as: ? Inability to control when you urinate (incontinence). ? Inability to completely empty the bladder (retention).  You feel as if your heart is racing (palpitations).  You feel dizzy, weak, or faint when you stand up. Get help right away if:  You cannot urinate.  You have sudden weakness or loss of coordination.  You have trouble speaking.  You have pain or pressure in your chest.  You have an irregular heartbeat.  You have sudden inability to move a part of your body. These symptoms may represent a serious problem that is an emergency. Do not wait to see if the symptoms will go away. Get medical help right away. Call your local emergency services (911 in the [place] Do not drive yourself to the hospital. Summary  Diabetic neuropathy is nerve damage that is caused by diabetes. It can cause numbness and pain in the arms, legs, digestive tract, heart, and other body systems.  This condition is treated by keeping your blood glucose level and your A1C level within your target range. This can help prevent neuropathy from getting worse.  Check your skin and feet every day for cuts, bruises, redness, blisters, or sores.  Do not use any products that contain nicotine or tobacco, such as cigarettes, e-cigarettes, and chewing tobacco. This information is not intended to replace advice given to you by your health care provider. Make sure you discuss any questions you have with your health care provider. Document Revised: 06/20/2019 Document Reviewed: 06/20/2019 Elsevier Patient Education  [place].
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
## 2020-03-17 LAB — NEUROPATHY PANEL
A/G Ratio: 1 (ref 0.7–1.7)
Albumin ELP: 3.8 g/dL (ref 2.9–4.4)
Alpha 1: 0.2 g/dL (ref 0.0–0.4)
Alpha 2: 0.7 g/dL (ref 0.4–1.0)
Angio Convert Enzyme: 43 U/L (ref 14–82)
Anti Nuclear Antibody (ANA): NEGATIVE
Beta: 1.2 g/dL (ref 0.7–1.3)
Gamma Globulin: 1.6 g/dL (ref 0.4–1.8)
Globulin, Total: 3.7 g/dL (ref 2.2–3.9)
Rheumatoid fact SerPl-aCnc: <10 IU/mL (ref <14.0)
Sed Rate: 14 mm/hr (ref 0–30)
TSH: 2.65 u[IU]/mL (ref 0.450–4.500)
Total Protein: 7.5 g/dL (ref 6.0–8.5)
Vit D, 25-Hydroxy: 31.2 ng/mL (ref 30.0–100.0)
Vitamin B-12: 1023 pg/mL (ref 232–1245)

## 2020-03-17 LAB — HEMOGLOBIN A1C
Est. average glucose Bld gHb Est-mCnc: 140 mg/dL
Hgb A1c MFr Bld: 6.5 % — ABNORMAL HIGH (ref 4.8–5.6)

## 2020-03-18 ENCOUNTER — Telehealth: Payer: Self-pay | Admitting: *Deleted

## 2020-03-18 NOTE — Progress Notes (Signed)
Kindly advise the patient that lab work for reversible causes of peripheral neuropathy was all normal except for borderline vitamin D level and screening test for diabetes.  Advised him to see his primary care physician for further evaluation or treatment for the same

## 2020-03-18 NOTE — Telephone Encounter (Signed)
I called the patient. He had some trouble hearing me. I spoke with his wife and discussed lab results. She verbalized understanding and is aware pt needs to f/u with Dr Vista Lawman. I also advised labs would be sent over.

## 2020-03-30 DIAGNOSIS — R0602 Shortness of breath: Secondary | ICD-10-CM | POA: Diagnosis not present

## 2020-03-30 DIAGNOSIS — I10 Essential (primary) hypertension: Secondary | ICD-10-CM | POA: Diagnosis not present

## 2020-03-30 DIAGNOSIS — M1710 Unilateral primary osteoarthritis, unspecified knee: Secondary | ICD-10-CM | POA: Diagnosis not present

## 2020-03-30 DIAGNOSIS — R609 Edema, unspecified: Secondary | ICD-10-CM | POA: Diagnosis not present

## 2020-03-30 DIAGNOSIS — N1832 Chronic kidney disease, stage 3b: Secondary | ICD-10-CM | POA: Diagnosis not present

## 2020-03-30 DIAGNOSIS — E1165 Type 2 diabetes mellitus with hyperglycemia: Secondary | ICD-10-CM | POA: Diagnosis not present

## 2020-03-30 DIAGNOSIS — E782 Mixed hyperlipidemia: Secondary | ICD-10-CM | POA: Diagnosis not present

## 2020-03-31 ENCOUNTER — Encounter: Payer: Medicare PPO | Admitting: Neurology

## 2020-04-07 ENCOUNTER — Ambulatory Visit (INDEPENDENT_AMBULATORY_CARE_PROVIDER_SITE_OTHER): Payer: Medicare PPO | Admitting: Neurology

## 2020-04-07 ENCOUNTER — Other Ambulatory Visit: Payer: Self-pay

## 2020-04-07 ENCOUNTER — Encounter (INDEPENDENT_AMBULATORY_CARE_PROVIDER_SITE_OTHER): Payer: Medicare PPO | Admitting: Neurology

## 2020-04-07 DIAGNOSIS — R531 Weakness: Secondary | ICD-10-CM

## 2020-04-07 DIAGNOSIS — R202 Paresthesia of skin: Secondary | ICD-10-CM

## 2020-04-07 DIAGNOSIS — Z0289 Encounter for other administrative examinations: Secondary | ICD-10-CM

## 2020-04-07 NOTE — Progress Notes (Signed)
Full Name: Dakota Scott Gender: Male MRN #: [id number] Date of Birth: 1934-02-01    Visit Date: 04/07/2020 09:28 Age: 87 Years Examining Physician: Arlice Colt, MD  Referring Physician: Antony Contras, MD   History: Dakota Scott is an 87 year old man with leg numbness.  On exam, he has reduced sensation to vibration at the ankles and absent sensation at the toes.  He has reduced sensation to pinprick below the mid lower leg and near absent sensation in the toes.  Strength is 4+/5 in the EHL muscles of the foot.  DTRs are absent.  Nerve conduction studies:  Bilateral peroneal and tibial motor responses had normal distal latencies, markedly reduced amplitudes and mildly to moderately reduced conduction velocities.  F-wave latencies were prolonged.  Bilateral sural and superficial peroneal sensory responses were absent.  Electromyography: Needle EMG of selected muscles of the right leg was performed.  There was moderate chronic denervation noted in the right gastrocnemius muscle and the right extensor digitorum brevis muscle.  The EDB muscle also had mild acute denervation.  There was mild chronic denervation in the vastus medialis, tibialis anterior and peroneus longus muscles.  Impression: This NCV/EMG study shows the following: 1.   Moderately severe length dependent sensorimotor polyneuropathy with axonal greater than demyelinating features.  This is consistent with diabetic polyneuropathy.  The most distal muscle tested showed acute denervation on top of the chronic denervation consistent with an ongoing process. 2.   Minimal superimposed chronic radiculopathies cannot be ruled out though more significant radiculopathies are unlikely.   Wilhemina Grall A. Felecia Shelling, MD, PhD, FAAN Certified in Neurology, Clinical Neurophysiology, Sleep Medicine, Pain Medicine and Neuroimaging Director, [place] [facility] [address] [place], [place] [address] [phone]    Verbal informed consent was obtained from the patient, patient was informed of potential risk of procedure, including bruising, bleeding, hematoma formation, infection, muscle weakness, muscle pain, numbness, among others.        [place]    Nerve / Sites Muscle Latency Ref. Amplitude Ref. Rel Amp Segments Distance Velocity Ref. Area    ms ms mV mV %  cm m/s m/s mVms  R Peroneal - EDB     Ankle EDB 6.1 ?6.5 0.5 ?2.0 100 Ankle - EDB 9   1.6     Fib head EDB 13.7  0.4  98 Fib head - Ankle 29 38 ?44 1.4     Pop fossa EDB 16.7  0.4  94.6 Pop fossa - Fib head 10 34 ?44 1.3         Pop fossa - Ankle      L Peroneal - EDB     Ankle EDB 5.4 ?6.5 0.8 ?2.0 100 Ankle - EDB 9   3.0     Fib head EDB 12.9  0.5  63.7 Fib head - Ankle 28 37 ?44 2.3     Pop fossa EDB 16.0  0.5  111 Pop fossa - Fib head 10 33 ?44 2.9         Pop fossa - Ankle      R Tibial - AH     Ankle AH 5.6 ?5.8 0.6 ?4.0 100 Ankle - AH 9   1.9     Pop fossa AH 18.6  0.3  54.8 Pop fossa - Ankle 38 29 ?41 0.8  L Tibial - AH     Ankle AH 5.3 ?5.8 0.6 ?4.0 100 Ankle -  AH 9   2.0     Pop fossa AH 19.6  0.5  84.1 Pop fossa - Ankle 38 27 ?41 1.6             SNC    Nerve / Sites Rec. Site Peak Lat Ref.  Amp Ref. Segments Distance    ms ms V V  cm  R Sural - Ankle (Calf)     Calf Ankle NR ?4.4 NR ?6 Calf - Ankle 14  L Sural - Ankle (Calf)     Calf Ankle NR ?4.4 NR ?6 Calf - Ankle 14  R Superficial peroneal - Ankle     Lat leg Ankle NR ?4.4 NR ?6 Lat leg - Ankle 14  L Superficial peroneal - Ankle     Lat leg Ankle NR ?4.4 NR ?6 Lat leg - Ankle 14             F  Wave    Nerve F Lat Ref.   ms ms  R Tibial - AH 70.3 ?56.0  L Tibial - AH 68.1 ?56.0         EMG Summary Table    Spontaneous MUAP Recruitment  Muscle IA Fib PSW Fasc Other Amp Dur. Poly Pattern  R. Vastus medialis Normal None None None _______ Normal Normal 1+ Reduced  R. Tibialis anterior Normal None None  None _______ Normal Increased 1+ Reduced  R. Peroneus longus Normal None None None _______ Increased Increased 1+ Reduced  R. Gastrocnemius (Medial head) Normal 1+ None None _______ Increased Increased 2+ Discrete  R. Extensor digitorum brevis Normal 1+ None None _______ Normal Increased 2+ Discrete  R. Iliopsoas Normal None None None _______ Normal Normal Normal Normal  R. Gluteus medius Normal None None None _______ Normal Normal Normal Normal

## 2020-04-20 DIAGNOSIS — R609 Edema, unspecified: Secondary | ICD-10-CM | POA: Diagnosis not present

## 2020-04-20 DIAGNOSIS — N1832 Chronic kidney disease, stage 3b: Secondary | ICD-10-CM | POA: Diagnosis not present

## 2020-04-20 DIAGNOSIS — I10 Essential (primary) hypertension: Secondary | ICD-10-CM | POA: Diagnosis not present

## 2020-04-20 DIAGNOSIS — E1165 Type 2 diabetes mellitus with hyperglycemia: Secondary | ICD-10-CM | POA: Diagnosis not present

## 2020-04-20 DIAGNOSIS — E782 Mixed hyperlipidemia: Secondary | ICD-10-CM | POA: Diagnosis not present

## 2020-04-20 DIAGNOSIS — M1710 Unilateral primary osteoarthritis, unspecified knee: Secondary | ICD-10-CM | POA: Diagnosis not present

## 2020-04-29 ENCOUNTER — Other Ambulatory Visit: Payer: Self-pay | Admitting: Neurology

## 2020-04-30 ENCOUNTER — Ambulatory Visit: Payer: Medicare PPO | Admitting: Cardiology

## 2020-05-15 ENCOUNTER — Ambulatory Visit: Payer: Medicare PPO | Admitting: Cardiology

## 2020-05-15 ENCOUNTER — Encounter: Payer: Self-pay | Admitting: Cardiology

## 2020-05-15 ENCOUNTER — Other Ambulatory Visit: Payer: Self-pay

## 2020-05-15 VITALS — BP 134/77 | HR 61 | Temp 98.0°F | Resp 16 | Ht 66.0 in | Wt 179.4 lb

## 2020-05-15 DIAGNOSIS — I10 Essential (primary) hypertension: Secondary | ICD-10-CM

## 2020-05-15 DIAGNOSIS — N1831 Chronic kidney disease, stage 3a: Secondary | ICD-10-CM

## 2020-05-15 DIAGNOSIS — I4821 Permanent atrial fibrillation: Secondary | ICD-10-CM

## 2020-05-15 DIAGNOSIS — E1142 Type 2 diabetes mellitus with diabetic polyneuropathy: Secondary | ICD-10-CM | POA: Diagnosis not present

## 2020-05-15 NOTE — Progress Notes (Signed)
Primary Physician/Referring:  Dakota Mccreedy, MD  Patient ID: TALBOT Dakota Scott, male    DOB: [birth year], 87 y.o.   MRN: [id number]  Chief Complaint  Patient presents with   Permanent atrial fibrillation    Hypertension   DOE   Follow-up    6 month   HPI:    HPI: Dakota Scott  is a 87 y.o. male  peripheral arterial disease, hypertension, controlled diabetes mellitus with stage 3 CKD, hyperlipidemia, permanent atrial fibrillation.     Presents for 26-month follow-up on hypertension, dyspnea and atrial fibrillation.  He has remained stable and states that his dyspnea is stable, no PND or orthopnea or leg edema.  His main complaint is severe pain and cramping in his feet especially at night and also paresthesia.    Past Medical History:  Diagnosis Date   Acute hepatitis C without mention of hepatic coma(070.51)    Atrial fibrillation ([place]) 09/07/2018   Claudication, intermittent ([place]) 04/25/2018   Diabetes mellitus without complication ([place])    Gout    Hyperlipidemia    Hypertension    Prostate cancer [facility])    Past Surgical History:  Procedure Laterality Date   EYE SURGERY  2009   cataract   Social History   Tobacco Use   Smoking status: Never Smoker   Smokeless tobacco: Never Used  Substance Use Topics   Alcohol use: Not Currently  Marital Status: Married   ROS  Review of Systems  Cardiovascular: Positive for dyspnea on exertion. Negative for chest pain, claudication, leg swelling, orthopnea, palpitations and syncope.  Hematologic/Lymphatic: Does not bruise/bleed easily.  Musculoskeletal: Positive for arthritis and back pain.  Gastrointestinal: Negative for melena.  Genitourinary: Positive for frequency.  Neurological: Positive for numbness (feet) and sensory change.   Objective  Blood pressure 134/77, pulse 61, temperature 98 F (36.7 C), temperature source Temporal, resp. rate 16, height 5\' 6"  (1.676 m), weight 179 lb 6.4 oz (81.4 kg), SpO2  96 %. Body mass index is 28.96 kg/m.   Physical Exam Constitutional:      General: He is not in acute distress.    Appearance: He is well-developed.     Comments: Mildly obese  Neck:     Thyroid: No thyromegaly.     Vascular: No JVD.  Cardiovascular:     Rate and Rhythm: Normal rate. Rhythm irregular.     Pulses:          Carotid pulses are 2+ on the right side and 2+ on the left side.      Radial pulses are 2+ on the right side and 2+ on the left side.       Dorsalis pedis pulses are 0 on the right side and 0 on the left side.       Posterior tibial pulses are 0 on the right side and 0 on the left side.     Heart sounds: S1 normal and S2 normal. No murmur heard. No gallop.      Comments: No leg edema.  No JVD.  Capillary refill <3 seconds. Pulmonary:     Effort: Pulmonary effort is normal.     Breath sounds: Normal breath sounds. No wheezing, rhonchi or rales.  Musculoskeletal:     Right lower leg: No edema.     Left lower leg: No edema.    Radiology: No results found.  Laboratory examination:    CMP Latest Ref Rng & Units 03/16/2020 08/24/2019 11/06/2018  Glucose 70 - 99 mg/dL -  259(H) 167(H)  BUN 8 - 23 mg/dL - 31(H) 22  Creatinine 0.61 - 1.24 mg/dL - 1.70(H) 1.37(H)  Sodium 135 - 145 mmol/L - 139 137  Potassium 3.5 - 5.1 mmol/L - 4.2 4.0  Chloride 98 - 111 mmol/L - 101 97(L)  CO2 22 - 32 mmol/L - 28 25  Calcium 8.9 - 10.3 mg/dL - 9.4 9.5  Total Protein 6.0 - 8.5 g/dL 7.5 8.1 7.9  Total Bilirubin 0.3 - 1.2 mg/dL - 0.9 1.3(H)  Alkaline Phos 38 - 126 U/L - 72 85  AST 15 - 41 U/L - 22 27  ALT 0 - 44 U/L - 18 21   CBC Latest Ref Rng & Units 08/24/2019 11/06/2018 02/10/2011  WBC 4.0 - 10.5 K/uL 5.3 9.2 4.7  Hemoglobin 13.0 - 17.0 g/dL 15.2 17.7(H) 14.9  Hematocrit 39.0 - 52.0 % 44.8 50.0 43.5  Platelets 150 - 400 K/uL 211 214 207.0   Lipid Panel     Component Value Date/Time   CHOL 159 04/26/2018 0956   TRIG 143 04/26/2018 0956   HDL 30 (L) 04/26/2018 0956    CHOLHDL 5 02/10/2011 0939   VLDL 19.8 02/10/2011 0939   LDLCALC 100 (H) 04/26/2018 0956   External labs:    Lab 03/30/2020:  Total cholesterol 132, triglycerides 118, HDL 31, LDL 81.  Non-HDL cholesterol 101.  Serum glucose 184, BUN 27, creatinine 1.51, potassium 4.3.   08/13/2019: HDL 28, triglycerides 199, total 141 A1c 7.3%  Cholesterol, total 153.000 M 08/17/2018 HDL 32.000 M 08/17/2018 LDL 100.000 04/26/2018 Triglycerides 183.000 M 08/17/2018  A1C 9.500 % 08/17/2018 TSH 2.450 04/05/2018  Hemoglobin 17.700 11/06/2018 Platelets 214.000 11/06/2018  Creatinine, Serum 1.370 11/06/2018 Potassium 4.000 11/06/2018 Magnesium N/D ALT (SGPT) 21.000 11/06/2018  Medications   Current Outpatient Medications on File Prior to Visit  Medication Sig Dispense Refill   acetaminophen (TYLENOL) 325 MG tablet Take 650 mg by mouth every 6 (six) hours as needed.     allopurinol (ZYLOPRIM) 100 MG tablet TAKE 1 TABLET BY MOUTH DAILY. (Patient taking differently: Take 100 mg by mouth daily.) 90 tablet 3   amLODipine (NORVASC) 5 MG tablet Take 1 tablet (5 mg total) by mouth daily. 90 tablet 3   colchicine 0.6 MG tablet At onset of gout flare take 2 tablets, may take 1 tablet 1 hr later if not relieved. May do this daily up to three days. (Patient taking differently: Take 0.6 mg by mouth daily. At onset of gout flare take 2 tablets, may take 1 tablet 1 hr later if not relieved. May do this daily up to three days.) 30 tablet 1   doxazosin (CARDURA) 2 MG tablet Take 1 tablet (2 mg total) by mouth at bedtime. (Patient taking differently: Take 4 mg by mouth daily.) 30 tablet 0   finasteride (PROSCAR) 5 MG tablet Take 1 tablet by mouth daily.     furosemide (LASIX) 20 MG tablet Take 1 tablet (20 mg total) by mouth daily. 90 tablet 1   hydrALAZINE (APRESOLINE) 25 MG tablet TAKE 1 TABLET BY MOUTH THREE TIMES DAILY 90 tablet 1   labetalol (NORMODYNE) 200 MG tablet Take 1 tablet (200 mg total) by mouth 2  (two) times daily. 90 tablet 3   losartan (COZAAR) 100 MG tablet Take 1 tablet (100 mg total) by mouth daily. 90 tablet 3   metFORMIN (GLUCOPHAGE-XR) 500 MG 24 hr tablet Take 500 mg by mouth 2 (two) times daily.      pravastatin (PRAVACHOL) 20  MG tablet Take 20 mg by mouth at bedtime.      prednisoLONE acetate (PRED FORTE) 1 % ophthalmic suspension Place 1 drop into both eyes 2 (two) times daily.     Rivaroxaban (XARELTO) 15 MG TABS tablet TAKE 1 TABLET BY MOUTH ONCE EVERY DAY WITH SUPPER 90 tablet 3   topiramate (TOPAMAX) 25 MG tablet TAKE 1 TABLET BY MOUTH TWICE DAILY 120 tablet 2   aspirin 81 MG chewable tablet Chew 1 tablet by mouth daily.     No current facility-administered medications on file prior to visit.    Cardiac Studies:   Echocardiogram 09/19/2019:  Hyperdynamic LV systolic function with visual EF >70%. Left ventricle cavity is normal in size. Severe left ventricular hypertrophy. Normal global wall motion. Unable to evaluate diastolic function due to atrial fibrillation. Elevated LAP. Calculated EF 50%.  Left atrial cavity is mildly dilated.  Right atrial cavity is mildly dilated.  Mild (Grade I) mitral regurgitation.  Moderate tricuspid regurgitation. Mild pulmonary hypertension. RVSP measures 37 mmHg.  IVC is dilated with respiratory variation.  No prior study for comparison.  Lexiscan Sestamibi stress test 04/16/2018: 1. Lexiscan stress test was performed. Exercise capacity was not assessed. No stress symptoms reported. Resting blood pressure was 150/110 mmHg and peak effect blood pressure was 110/78 mmHg. The resting and stress electrocardiogram demonstrated atrial fibrillation, normal resting conduction, low voltage, and normal rest repolarization. Stress EKG is non diagnostic for ischemia as it is a pharmacologic stress. 2. The overall quality of the study is good. There is no evidence of abnormal lung activity. Stress and rest SPECT images demonstrate homogeneous  tracer distribution throughout the myocardium. Gated SPECT imaging reveals normal myocardial thickening and wall motion. The left ventricular ejection fraction was normal (57%).  3. Low risk study.  Outside Echocardiogram 09/06/2013: Normal LV systolic function, mild LVH, grade 1 diastolic dysfunction.  Lower extremity arterial duplex 12/01/2015: No hemodynamically significant stenoses are identified in thelower extremity arterial system. This exam reveals moderately decreased perfusion of the lower extremity, with RABI 0.76 and LABI 0.66 noted at the post tibial artery level. There is diffuse plaque and abnormal waveform in vessels below knee suggests small vessel disease.  EKG:    EKG 05/15/2020: Atrial fibrillation with controlled ventricular response at rate of 72 bpm, left axis deviation, left anterior fascicular block.  Incomplete right bundle branch block.  Poor R wave progression, cannot exclude anteroseptal infarct old.  Low-voltage complexes.  Pulmonary disease pattern.  Nonspecific T abnormality.  No significant change from 09/16/2019.      Assessment   No orders of the defined types were placed in this encounter.    ICD-10-CM   1. Permanent atrial fibrillation (HCC)  I48.21 EKG 12-Lead  2. Essential hypertension  I10   3. Stage 3a chronic kidney disease (HCC)  N18.31   4. Diabetic peripheral neuropathy associated with type 2 diabetes mellitus (HCC)  E11.42    CHA2DS2-VASc Score is 4.  Yearly risk of stroke: 4.8% (A, HTN, DM).  Score of 1=0.6; 2=2.2; 3=3.2; 4=4.8; 5=7.2; 6=9.8; 7=>9.8) -(CHF; HTN; vasc disease DM,  Male = 1; Age <65 =0; 65-74 = 1,  >75 =2; stroke/embolism= 2).    Recommendations:   RAVON MORTELLARO  is a 87 y.o. peripheral arterial disease, hypertension, controlled diabetes mellitus with stage 3 CKD, hyperlipidemia, permanent atrial fibrillation.     Presents for 72-month follow-up on hypertension, dyspnea and atrial fibrillation.  He has remained stable and  states that his dyspnea  is stable, no PND or orthopnea or leg edema.  His main complaint is severe pain and cramping in his feet especially at night and also paresthesia.  He has recently had peripheral nerve conduction study which reveals severely abnormal study due to diabetes.  He will discuss with his neurologist regarding again trying Neurontin at a very low dose only in the evening as patient previously had severe fatigue and drowsiness when he took 3 times a day.  With regard to atrial fibrillation, rate is well controlled, reviewed external labs, renal function has remained stable and his CBC is also remained stable.  Diabetes and hyperlipidemia being managed by PCP, goal LDL <70.  He does have peripheral arterial disease by physical exam as well however his symptoms are more consistent with peripheral neuropathy than PAD.  As there is no limb threatening ischemia and his capillary refill is within normal limits, would recommend continued medical therapy for now.  Stage III kidney disease also his advanced age, would consider peripheral arteriogram if he develops any symptoms of rest pain.    Adrian Prows, MD, [facility] 05/15/2020, 10:24 AM Office: [phone] Pager: [phone]

## 2020-09-14 ENCOUNTER — Other Ambulatory Visit: Payer: Self-pay

## 2020-09-14 ENCOUNTER — Ambulatory Visit (INDEPENDENT_AMBULATORY_CARE_PROVIDER_SITE_OTHER): Payer: Medicare PPO | Admitting: Neurology

## 2020-09-14 ENCOUNTER — Encounter: Payer: Self-pay | Admitting: Neurology

## 2020-09-14 VITALS — BP 128/74 | HR 68 | Ht 66.0 in | Wt 180.0 lb

## 2020-09-14 DIAGNOSIS — E1142 Type 2 diabetes mellitus with diabetic polyneuropathy: Secondary | ICD-10-CM | POA: Diagnosis not present

## 2020-09-14 DIAGNOSIS — R269 Unspecified abnormalities of gait and mobility: Secondary | ICD-10-CM | POA: Diagnosis not present

## 2020-09-14 MED ORDER — TOPIRAMATE 25 MG PO TABS: 50.0000 mg | ORAL_TABLET | Freq: Two times a day (BID) | ORAL | 2 refills | Status: DC
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
## 2020-09-14 NOTE — Patient Instructions (Signed)
I had a long discussion with the patient regarding his lower extremity paresthesias and gait ataxia which are likely related to his diabetic polyneuropathy.  I recommend he increase the Topamax to 50 mg twice daily to help with his paresthesias and he was advised to use a cane at all times.  He was advised to stay on aspirin for stroke prevention and maintain aggressive risk factor modification with strict control of hypertension with blood pressure goal below 130/90, lipids with LDL cholesterol goal below 70 mg percent and diabetes with hemoglobin A1c goal below 6.5%.  I advised him to follow-up with his primary care physician for his complaints of shortness of breath and leg swelling.  He will return for follow-up in the future in 6 months or call earlier if necessary. Diabetic Neuropathy Diabetic neuropathy refers to nerve damage that is caused by diabetes. Over time, people with diabetes can develop nerve damage throughout the body. There are several types of diabetic neuropathy: Peripheral neuropathy. This is the most common type of diabetic neuropathy. It damages the nerves that carry signals between the spinal cord and other parts of the body (peripheral nerves). This usually affects nerves in the feet, legs, hands, and arms. Autonomic neuropathy. This type causes damage to nerves that control involuntary functions (autonomic nerves). Involuntary functions are functions of the body that you do not control. They include heartbeat, body temperature, blood pressure, urination, digestion, sweating, sexual function, or response to changes in blood glucose. Focal neuropathy. This type of nerve damage affects one area of the body, such as an arm, a leg, or the face. The injury may involve one nerve or a small group of nerves. Focal neuropathy can be painful and unpredictable. It occurs most often in older adults with diabetes. This often develops suddenly, but usually improves over time and does not cause  long-term problems. Proximal neuropathy. This type of nerve damage affects the nerves of the thighs, hips, buttocks, or legs. It causes severe pain, weakness, and muscle death (atrophy), usually in the thigh muscles. It is more common among older men and people who have type 2 diabetes. The length of recovery time may vary. What are the causes? Peripheral, autonomic, and focal neuropathies are caused by diabetes that is not well controlled with treatment. The cause of proximal neuropathy is not known, but it may be caused by inflammation related to uncontrolled bloodglucose levels. What are the signs or symptoms? Peripheral neuropathy Peripheral neuropathy develops slowly over time. When the nerves of the feet and legs no longer work, you may experience: Burning, stabbing, or aching pain in the legs or feet. Pain or cramping in the legs or feet. Loss of feeling (numbness) and inability to feel pressure or pain in the feet. This can lead to: Thick calluses or sores on areas of constant pressure. Ulcers. Reduced ability to feel temperature changes. Foot deformities. Muscle weakness. Loss of balance or coordination. Autonomic neuropathy The symptoms of autonomic neuropathy vary depending on which nerves are affected. Symptoms may include: Problems with digestion, such as: Nausea or vomiting. Poor appetite. Bloating. Diarrhea or constipation. Trouble swallowing. Losing weight without trying to. Problems with the heart, blood, and lungs, such as: Dizziness, especially when standing up. Fainting. Shortness of breath. Irregular heartbeat. Bladder problems, such as: Trouble starting or stopping urination. Leaking urine. Trouble emptying the bladder. Urinary tract infections (UTIs). Problems with other body functions, such as: Sweat. You may sweat too much or too little. Temperature. You might get hot easily. Or,  you might feel cold more than usual. Sexual function. Men may not be able  to get or maintain an erection. Women may have vaginal dryness and difficulty with arousal. Focal neuropathy Symptoms affect only one area of the body. Common symptoms include: Numbness. Tingling. Burning pain. Prickling feeling. Very sensitive skin. Weakness. Inability to move (paralysis). Muscle twitching. Muscles getting smaller (wasting). Poor coordination. Double or blurred vision. Proximal neuropathy Sudden, severe pain in the hip, thigh, or buttocks. Pain may spread from the back into the legs (sciatica). Pain and numbness in the arms and legs. Tingling. Loss of bladder control or bowel control. Weakness and wasting of thigh muscles. Difficulty getting up from a seated position. Abdominal swelling. Unexplained weight loss. How is this diagnosed? Diagnosis varies depending on the type of neuropathy your health care providersuspects. Peripheral neuropathy Your health care provider will do a neurologic exam. This exam checks your reflexes, how you move, and what you can feel. You may have other tests, such as: Blood tests. Tests of the fluid that surrounds the spinal cord (lumbar puncture). CT scan. MRI. Checking the nerves that control muscles (electromyogram, or EMG). Checking how quickly signals pass through your nerves (nerve conduction study). Checking a small piece of a nerve using a microscope (biopsy). Autonomic neuropathy You may have tests, such as: Tests to measure your blood pressure and heart rate. You may be secured to an exam table that moves you from a lying position to an upright position (table tilt test). Breathing tests to check your lungs. Tests to check how food moves through the digestive system (gastric emptying tests). Blood, sweat, or urine tests. Ultrasound of your bladder. Spinal fluid tests. Focal neuropathy This condition may be diagnosed with: A neurologic exam. CT scan. MRI. EMG. Nerve conduction study. Proximal neuropathy There is  no test to diagnose this type of neuropathy. You may have tests to rule out other possible causes of this type of neuropathy. Tests may include: X-rays of your spine and lumbar region. Lumbar puncture. MRI. How is this treated? The goal of treatment is to keep nerve damage from getting worse. Treatment may include: Following your diabetes management plan. This will help keep your blood glucose level and your A1C level within your target range. This is the most important treatment. Using prescription pain medicine. Follow these instructions at home: Diabetes management Follow your diabetes management plan as told by your health care provider. Check your blood glucose levels. Keep your blood glucose in your target range. Have your A1C level checked at least two times a year, or as often as told. Take over the counter and prescription medicines only as told by your health care provider. This includes insulin and diabetes medicine.  Lifestyle  Do not use any products that contain nicotine or tobacco, such as cigarettes, e-cigarettes, and chewing tobacco. If you need help quitting, ask your health care provider. Be physically active every day. Include strength training and balance exercises. Follow a healthy meal plan. Work with your health care provider to manage your blood pressure.  General instructions Ask your health care provider if the medicine prescribed to you requires you to avoid driving or using machinery. Check your skin and feet every day for cuts, bruises, redness, blisters, or sores. Keep all follow-up visits. This is important. Contact a health care provider if: You have burning, stabbing, or aching pain in your legs or feet. You are unable to feel pressure or pain in your feet. You develop problems with digestion,  such as: Nausea. Vomiting. Bloating. Constipation. Diarrhea. Abdominal pain. You have difficulty with urination, such as: Inability to control when you  urinate (incontinence). Inability to completely empty the bladder (retention). You feel as if your heart is racing (palpitations). You feel dizzy, weak, or faint when you stand up. Get help right away if: You cannot urinate. You have sudden weakness or loss of coordination. You have trouble speaking. You have pain or pressure in your chest. You have an irregular heartbeat. You have sudden inability to move a part of your body. These symptoms may represent a serious problem that is an emergency. Do not wait to see if the symptoms will go away. Get medical help right away. Call your local emergency services (911 in the [place] Do not drive yourself to the hospital. Summary Diabetic neuropathy is nerve damage that is caused by diabetes. It can cause numbness and pain in the arms, legs, digestive tract, heart, and other body systems. This condition is treated by keeping your blood glucose level and your A1C level within your target range. This can help prevent neuropathy from getting worse. Check your skin and feet every day for cuts, bruises, redness, blisters, or sores. Do not use any products that contain nicotine or tobacco, such as cigarettes, e-cigarettes, and chewing tobacco. This information is not intended to replace advice given to you by your health care provider. Make sure you discuss any questions you have with your healthcare provider. Document Revised: 06/20/2019 Document Reviewed: 06/20/2019 Elsevier Patient Education  [place].

## 2020-09-16 ENCOUNTER — Telehealth: Payer: Self-pay

## 2020-09-16 NOTE — Telephone Encounter (Addendum)
PA submitted to [place], [facility] - PA Case ID: [id number] - Rx #: [id number]  Response: [employer] will review the request and will issue a decision, typically within 3-7 days from your submission

## 2020-09-17 NOTE — Telephone Encounter (Signed)
Outcome Approvedon July 27 Status: Approved, Coverage Starts on: 02/22/2020 12:00:00 AM, Coverage Ends on: 02/20/2021 12:00:00 AM.

## 2020-09-28 DIAGNOSIS — R351 Nocturia: Secondary | ICD-10-CM | POA: Diagnosis not present

## 2020-09-28 DIAGNOSIS — N401 Enlarged prostate with lower urinary tract symptoms: Secondary | ICD-10-CM | POA: Diagnosis not present

## 2020-09-28 DIAGNOSIS — C61 Malignant neoplasm of prostate: Secondary | ICD-10-CM | POA: Diagnosis not present

## 2020-10-27 ENCOUNTER — Other Ambulatory Visit: Payer: Self-pay | Admitting: Student

## 2020-10-27 DIAGNOSIS — I4821 Permanent atrial fibrillation: Secondary | ICD-10-CM

## 2020-10-27 DIAGNOSIS — I10 Essential (primary) hypertension: Secondary | ICD-10-CM

## 2020-11-03 IMAGING — CT CT L SPINE W/O CM
3 series · 13 of 33 positions shown, 16 images · non-contrast
Comparison: None.

CLINICAL DATA: Lower back pain.

EXAM:
CT LUMBAR SPINE WITHOUT CONTRAST
TECHNIQUE: Multidetector CT imaging of the lumbar spine was performed without
intravenous contrast administration. Multiplanar CT image
reconstructions were also generated.

[Series 5: l spine soft · axial · 0.34mm/px · z∈[-710,-526]mm · 5 of 134 slices shown, 7 images]
[im 21/134  soft-tissue]
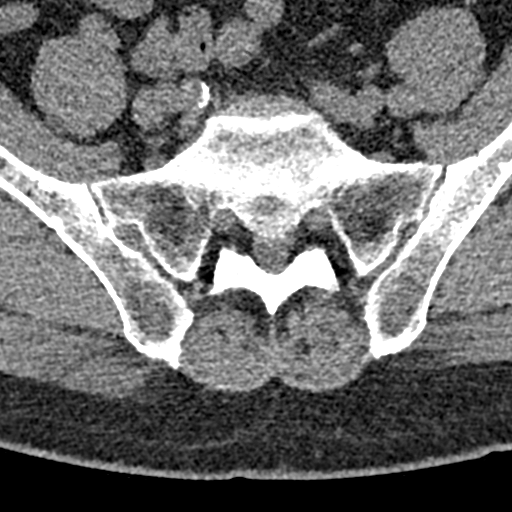
[im 21/134  bone]
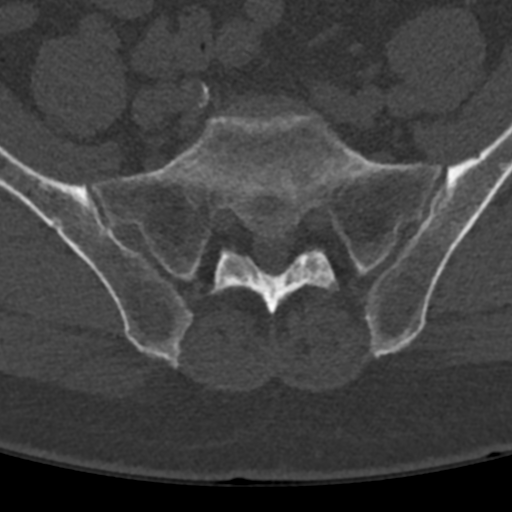
[im 41/134  bone]
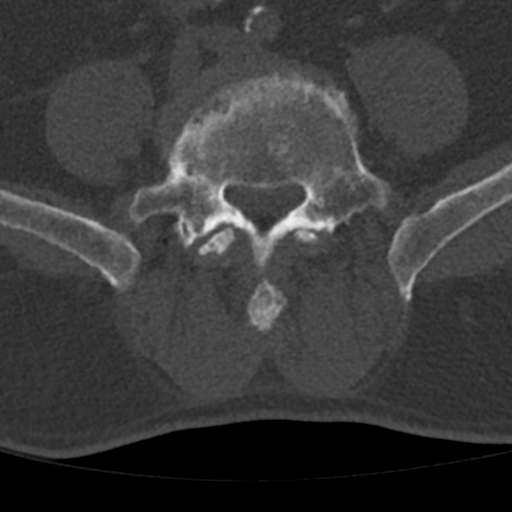
[im 72/134  bone]
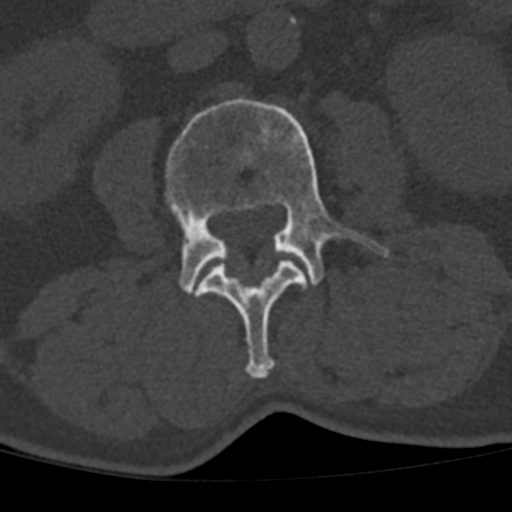
[im 93/134  bone]
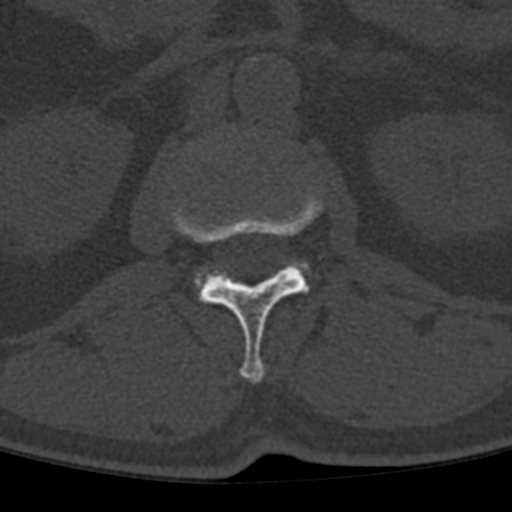
[im 113/134  soft-tissue]
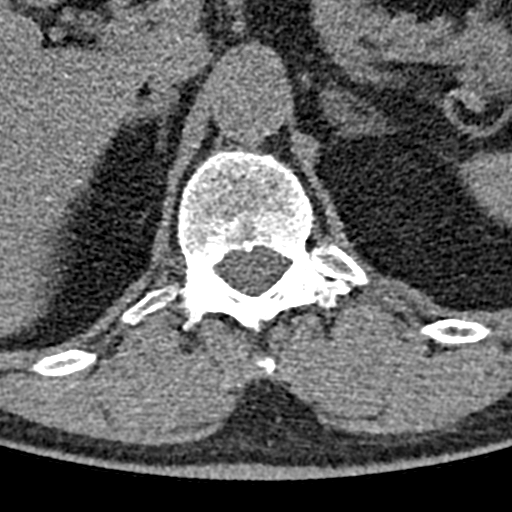
[im 113/134  bone]
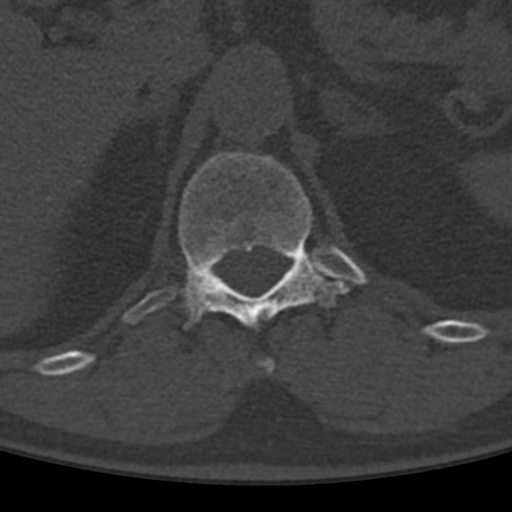

[Series 8: sagittal bone · sagittal · 0.39mm/px · 5 of 74 slices shown, 6 images]
[im 25/74  bone]
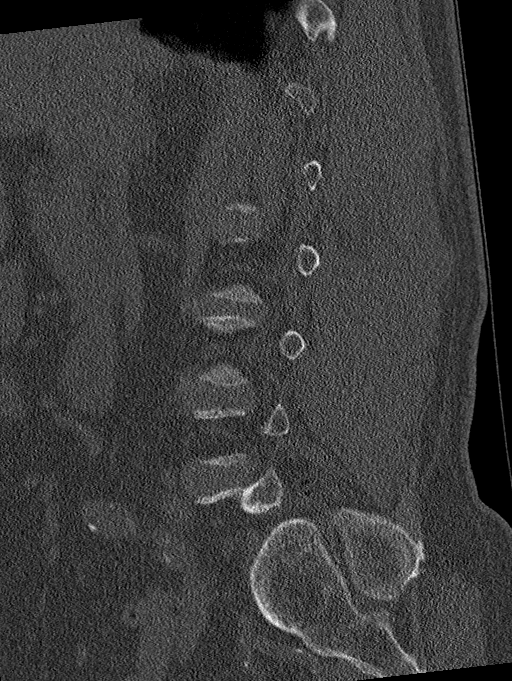
[im 31/74  bone]
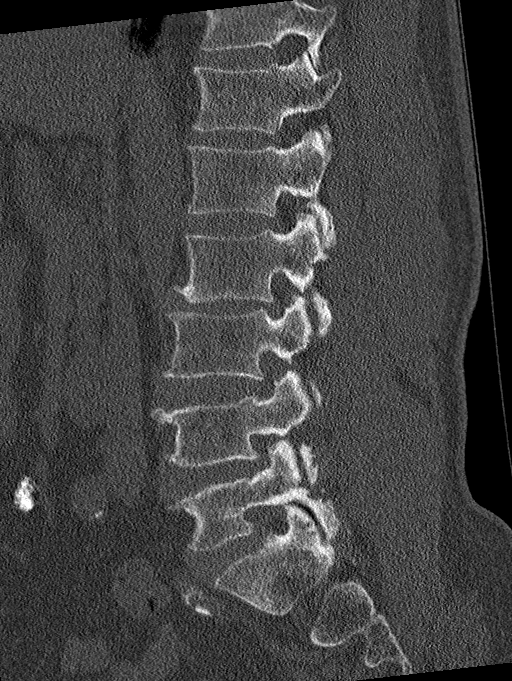
[im 37/74  soft-tissue]
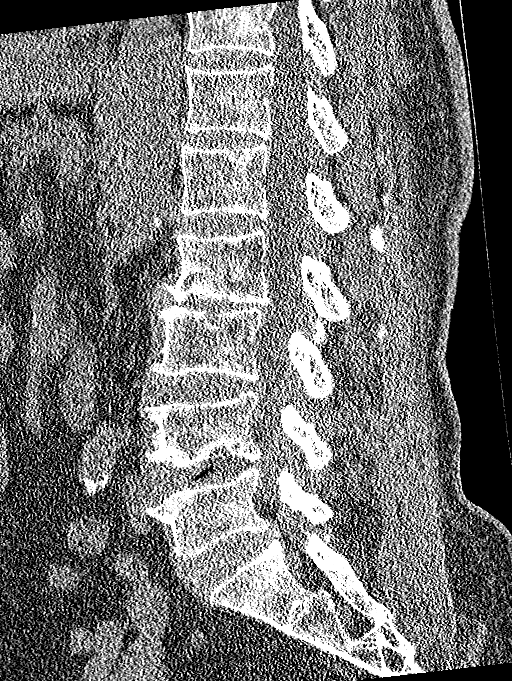
[im 37/74  bone]
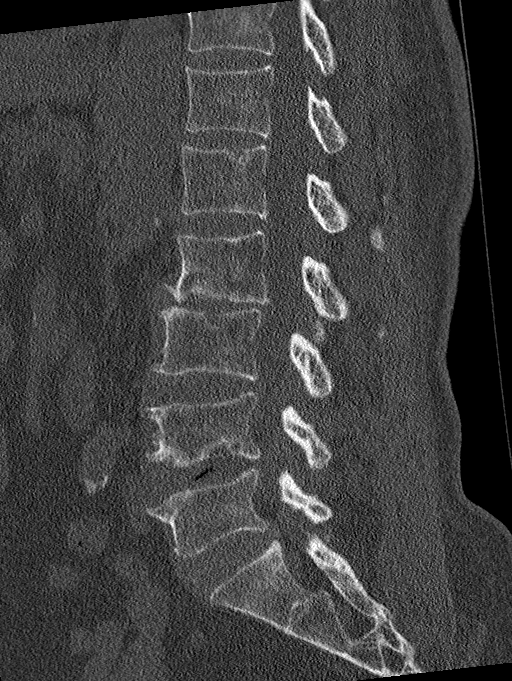
[im 43/74  bone]
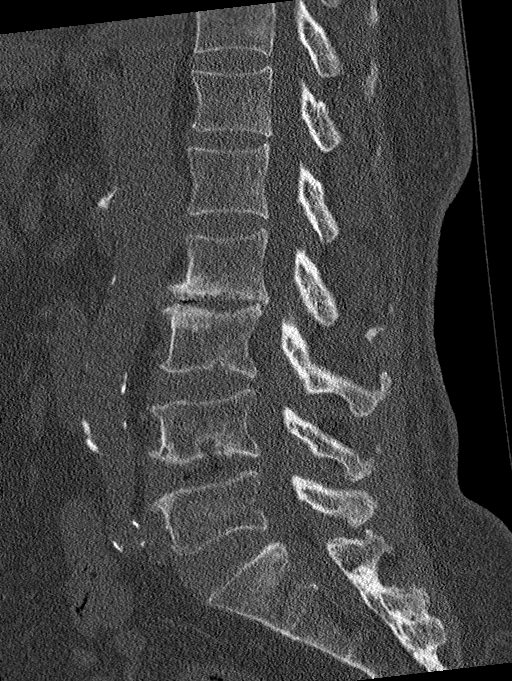
[im 49/74  bone]
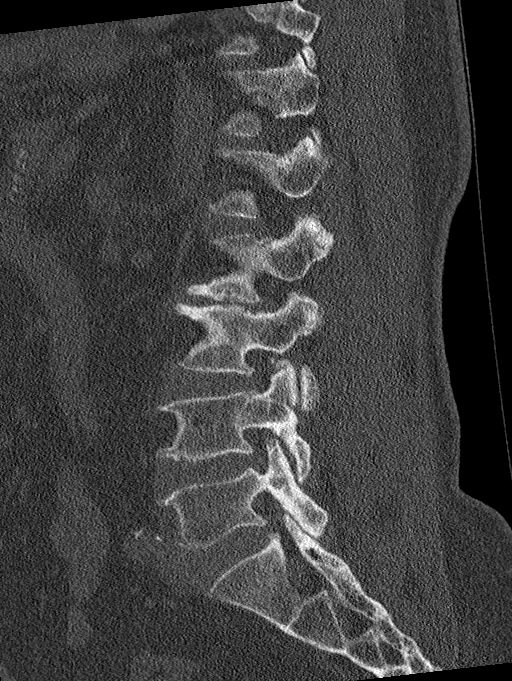

[Series 9: coronal bone · coronal · 0.39mm/px · 3 of 64 slices shown]
[im 13/64  bone]
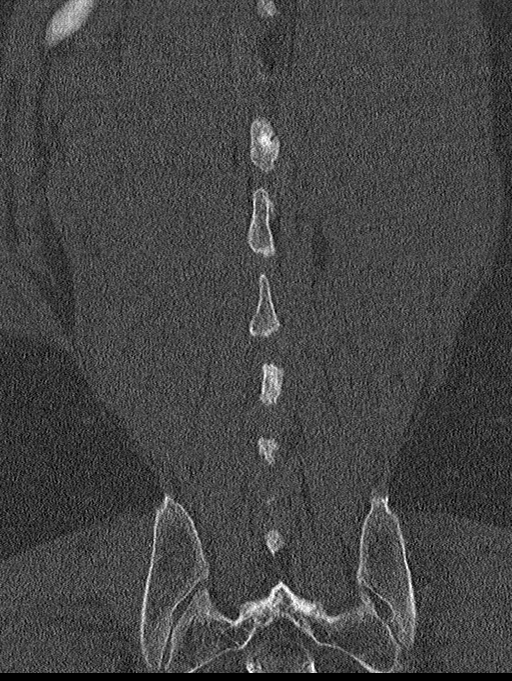
[im 26/64  bone]
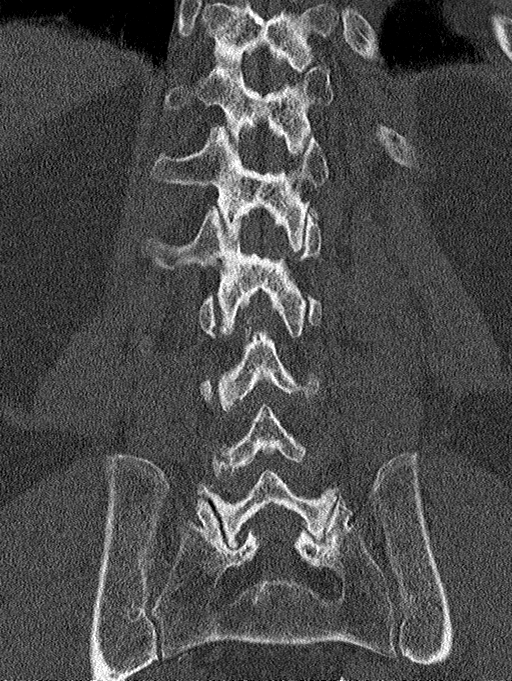
[im 38/64  bone]
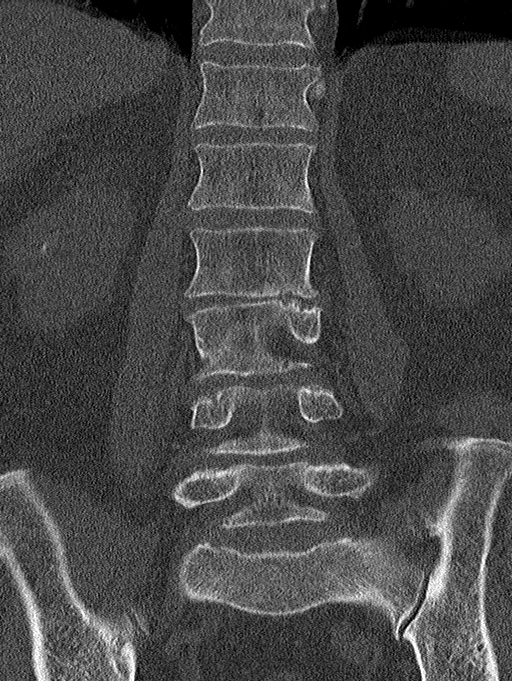

[13 of 33 positions shown; findings below may reference images not displayed]

FINDINGS: Segmentation: 5 lumbar type vertebrae.

Alignment: There is approximately 1 mm to 2 mm retrolisthesis of the
L2 vertebral body on L3.

Vertebrae: No acute fracture or focal pathologic process.

Paraspinal and other soft tissues: Negative. Of incidental note is
the presence of a 1.6 cm diameter partially calcified right renal
cyst.

There is marked severity calcification of the abdominal aorta and
bilateral common iliac arteries.

Disc levels: Moderate severity endplate sclerosis with mild to
moderate severity anterior osteophyte formation is seen at the
levels of L2-L3 and L4-L5. Mild anterior osteophyte formation is
seen at the level of L3-L4.

Moderate to marked severity intervertebral disc space narrowing is
seen at the level of L2-L3 with mild intervertebral disc space
narrowing noted at the levels of L3-L4 and L4-L5. Vacuum disc
phenomenon is seen at the level of L4-L5.

Mild to moderate severity bilateral multilevel facet joint
hypertrophy is noted throughout the lumbar spine.
IMPRESSION: 1. No acute osseous abnormality.
2. Moderate to marked severity degenerative changes at the levels of
L2-L3 and L4-L5.

Aortic Atherosclerosis ([id number]-NR1.1).

## 2020-11-13 DIAGNOSIS — N182 Chronic kidney disease, stage 2 (mild): Secondary | ICD-10-CM | POA: Diagnosis not present

## 2020-11-13 DIAGNOSIS — R7989 Other specified abnormal findings of blood chemistry: Secondary | ICD-10-CM | POA: Diagnosis not present

## 2020-11-13 DIAGNOSIS — N189 Chronic kidney disease, unspecified: Secondary | ICD-10-CM | POA: Diagnosis not present

## 2020-11-13 DIAGNOSIS — Z6829 Body mass index (BMI) 29.0-29.9, adult: Secondary | ICD-10-CM | POA: Diagnosis not present

## 2020-11-13 DIAGNOSIS — N400 Enlarged prostate without lower urinary tract symptoms: Secondary | ICD-10-CM | POA: Diagnosis not present

## 2020-11-13 DIAGNOSIS — M109 Gout, unspecified: Secondary | ICD-10-CM | POA: Diagnosis not present

## 2020-11-13 DIAGNOSIS — I129 Hypertensive chronic kidney disease with stage 1 through stage 4 chronic kidney disease, or unspecified chronic kidney disease: Secondary | ICD-10-CM | POA: Diagnosis not present

## 2020-11-20 DIAGNOSIS — I4891 Unspecified atrial fibrillation: Secondary | ICD-10-CM | POA: Diagnosis not present

## 2020-11-20 DIAGNOSIS — E1122 Type 2 diabetes mellitus with diabetic chronic kidney disease: Secondary | ICD-10-CM | POA: Diagnosis not present

## 2020-11-20 DIAGNOSIS — E1165 Type 2 diabetes mellitus with hyperglycemia: Secondary | ICD-10-CM | POA: Diagnosis not present

## 2020-11-20 DIAGNOSIS — E785 Hyperlipidemia, unspecified: Secondary | ICD-10-CM | POA: Diagnosis not present

## 2020-11-20 DIAGNOSIS — C61 Malignant neoplasm of prostate: Secondary | ICD-10-CM | POA: Diagnosis not present

## 2020-11-20 DIAGNOSIS — E1143 Type 2 diabetes mellitus with diabetic autonomic (poly)neuropathy: Secondary | ICD-10-CM | POA: Diagnosis not present

## 2020-11-20 DIAGNOSIS — E1142 Type 2 diabetes mellitus with diabetic polyneuropathy: Secondary | ICD-10-CM | POA: Diagnosis not present

## 2020-11-20 DIAGNOSIS — D6869 Other thrombophilia: Secondary | ICD-10-CM | POA: Diagnosis not present

## 2020-11-20 DIAGNOSIS — E1151 Type 2 diabetes mellitus with diabetic peripheral angiopathy without gangrene: Secondary | ICD-10-CM | POA: Diagnosis not present

## 2020-12-25 ENCOUNTER — Other Ambulatory Visit: Payer: Self-pay | Admitting: Neurology

## 2020-12-25 DIAGNOSIS — M109 Gout, unspecified: Secondary | ICD-10-CM | POA: Diagnosis not present

## 2020-12-25 DIAGNOSIS — E782 Mixed hyperlipidemia: Secondary | ICD-10-CM | POA: Diagnosis not present

## 2020-12-25 DIAGNOSIS — M1710 Unilateral primary osteoarthritis, unspecified knee: Secondary | ICD-10-CM | POA: Diagnosis not present

## 2020-12-25 DIAGNOSIS — E1165 Type 2 diabetes mellitus with hyperglycemia: Secondary | ICD-10-CM | POA: Diagnosis not present

## 2020-12-25 DIAGNOSIS — E559 Vitamin D deficiency, unspecified: Secondary | ICD-10-CM | POA: Diagnosis not present

## 2020-12-25 DIAGNOSIS — I10 Essential (primary) hypertension: Secondary | ICD-10-CM | POA: Diagnosis not present

## 2020-12-25 DIAGNOSIS — Z0001 Encounter for general adult medical examination with abnormal findings: Secondary | ICD-10-CM | POA: Diagnosis not present

## 2021-01-05 ENCOUNTER — Other Ambulatory Visit: Payer: Self-pay | Admitting: Student

## 2021-01-05 DIAGNOSIS — I4821 Permanent atrial fibrillation: Secondary | ICD-10-CM

## 2021-01-07 DIAGNOSIS — E559 Vitamin D deficiency, unspecified: Secondary | ICD-10-CM | POA: Diagnosis not present

## 2021-01-07 DIAGNOSIS — M109 Gout, unspecified: Secondary | ICD-10-CM | POA: Diagnosis not present

## 2021-01-07 DIAGNOSIS — E1165 Type 2 diabetes mellitus with hyperglycemia: Secondary | ICD-10-CM | POA: Diagnosis not present

## 2021-01-07 DIAGNOSIS — Z125 Encounter for screening for malignant neoplasm of prostate: Secondary | ICD-10-CM | POA: Diagnosis not present

## 2021-01-07 DIAGNOSIS — E782 Mixed hyperlipidemia: Secondary | ICD-10-CM | POA: Diagnosis not present

## 2021-01-22 DIAGNOSIS — R600 Localized edema: Secondary | ICD-10-CM | POA: Diagnosis not present

## 2021-01-22 DIAGNOSIS — M109 Gout, unspecified: Secondary | ICD-10-CM | POA: Diagnosis not present

## 2021-01-22 DIAGNOSIS — I10 Essential (primary) hypertension: Secondary | ICD-10-CM | POA: Diagnosis not present

## 2021-01-22 DIAGNOSIS — E782 Mixed hyperlipidemia: Secondary | ICD-10-CM | POA: Diagnosis not present

## 2021-01-22 DIAGNOSIS — E1165 Type 2 diabetes mellitus with hyperglycemia: Secondary | ICD-10-CM | POA: Diagnosis not present

## 2021-01-22 DIAGNOSIS — Z0184 Encounter for antibody response examination: Secondary | ICD-10-CM | POA: Diagnosis not present

## 2021-01-22 DIAGNOSIS — Z298 Encounter for other specified prophylactic measures: Secondary | ICD-10-CM | POA: Diagnosis not present

## 2021-03-29 ENCOUNTER — Ambulatory Visit: Payer: Medicare PPO | Admitting: Neurology

## 2021-05-03 DIAGNOSIS — R0602 Shortness of breath: Secondary | ICD-10-CM | POA: Diagnosis not present

## 2021-05-03 DIAGNOSIS — I10 Essential (primary) hypertension: Secondary | ICD-10-CM | POA: Diagnosis not present

## 2021-05-03 DIAGNOSIS — Z125 Encounter for screening for malignant neoplasm of prostate: Secondary | ICD-10-CM | POA: Diagnosis not present

## 2021-05-03 DIAGNOSIS — E1165 Type 2 diabetes mellitus with hyperglycemia: Secondary | ICD-10-CM | POA: Diagnosis not present

## 2021-05-03 DIAGNOSIS — Z1329 Encounter for screening for other suspected endocrine disorder: Secondary | ICD-10-CM | POA: Diagnosis not present

## 2021-05-03 DIAGNOSIS — R600 Localized edema: Secondary | ICD-10-CM | POA: Diagnosis not present

## 2021-05-03 DIAGNOSIS — E782 Mixed hyperlipidemia: Secondary | ICD-10-CM | POA: Diagnosis not present

## 2021-05-03 DIAGNOSIS — Z0001 Encounter for general adult medical examination with abnormal findings: Secondary | ICD-10-CM | POA: Diagnosis not present

## 2021-05-12 DIAGNOSIS — R531 Weakness: Secondary | ICD-10-CM | POA: Diagnosis not present

## 2021-05-12 DIAGNOSIS — M109 Gout, unspecified: Secondary | ICD-10-CM | POA: Diagnosis not present

## 2021-05-12 DIAGNOSIS — N400 Enlarged prostate without lower urinary tract symptoms: Secondary | ICD-10-CM | POA: Diagnosis not present

## 2021-05-12 DIAGNOSIS — Z6829 Body mass index (BMI) 29.0-29.9, adult: Secondary | ICD-10-CM | POA: Diagnosis not present

## 2021-05-12 DIAGNOSIS — R7989 Other specified abnormal findings of blood chemistry: Secondary | ICD-10-CM | POA: Diagnosis not present

## 2021-05-12 DIAGNOSIS — I129 Hypertensive chronic kidney disease with stage 1 through stage 4 chronic kidney disease, or unspecified chronic kidney disease: Secondary | ICD-10-CM | POA: Diagnosis not present

## 2021-05-12 DIAGNOSIS — N182 Chronic kidney disease, stage 2 (mild): Secondary | ICD-10-CM | POA: Diagnosis not present

## 2021-05-16 ENCOUNTER — Encounter: Payer: Self-pay | Admitting: Cardiology

## 2021-05-17 ENCOUNTER — Ambulatory Visit: Payer: Medicare PPO | Admitting: Cardiology

## 2021-05-20 ENCOUNTER — Other Ambulatory Visit: Payer: Self-pay | Admitting: Cardiology

## 2021-05-20 DIAGNOSIS — I4821 Permanent atrial fibrillation: Secondary | ICD-10-CM

## 2021-05-20 DIAGNOSIS — I10 Essential (primary) hypertension: Secondary | ICD-10-CM

## 2021-06-17 ENCOUNTER — Other Ambulatory Visit: Payer: Self-pay | Admitting: *Deleted

## 2021-06-17 MED ORDER — TOPIRAMATE 50 MG PO TABS: ORAL_TABLET | ORAL | 0 refills | Status: DC

## 2021-06-21 DIAGNOSIS — R079 Chest pain, unspecified: Secondary | ICD-10-CM | POA: Diagnosis not present

## 2021-06-21 DIAGNOSIS — R051 Acute cough: Secondary | ICD-10-CM | POA: Diagnosis not present

## 2021-06-21 DIAGNOSIS — R0602 Shortness of breath: Secondary | ICD-10-CM | POA: Diagnosis not present

## 2021-06-21 DIAGNOSIS — J019 Acute sinusitis, unspecified: Secondary | ICD-10-CM | POA: Diagnosis not present

## 2021-06-21 DIAGNOSIS — J302 Other seasonal allergic rhinitis: Secondary | ICD-10-CM | POA: Diagnosis not present

## 2021-06-21 DIAGNOSIS — J189 Pneumonia, unspecified organism: Secondary | ICD-10-CM | POA: Diagnosis not present

## 2021-06-21 DIAGNOSIS — R600 Localized edema: Secondary | ICD-10-CM | POA: Diagnosis not present

## 2021-06-28 ENCOUNTER — Encounter: Payer: Self-pay | Admitting: Cardiology

## 2021-06-28 ENCOUNTER — Ambulatory Visit: Payer: Medicare PPO | Admitting: Cardiology

## 2021-06-28 VITALS — BP 140/80 | HR 83 | Temp 98.0°F | Resp 16 | Ht 66.0 in | Wt 193.4 lb

## 2021-06-28 DIAGNOSIS — R600 Localized edema: Secondary | ICD-10-CM | POA: Diagnosis not present

## 2021-06-28 DIAGNOSIS — N1831 Chronic kidney disease, stage 3a: Secondary | ICD-10-CM | POA: Diagnosis not present

## 2021-06-28 DIAGNOSIS — I4821 Permanent atrial fibrillation: Secondary | ICD-10-CM

## 2021-06-28 DIAGNOSIS — R0609 Other forms of dyspnea: Secondary | ICD-10-CM | POA: Diagnosis not present

## 2021-06-28 DIAGNOSIS — I10 Essential (primary) hypertension: Secondary | ICD-10-CM | POA: Diagnosis not present

## 2021-06-28 MED ORDER — TORSEMIDE 20 MG PO TABS: 20.0000 mg | ORAL_TABLET | ORAL | 3 refills | Status: DC

## 2021-06-28 NOTE — Progress Notes (Signed)
? ?Primary Physician/Referring:  Benito Mccreedy, MD ? ?Patient ID: Dakota Scott, male    DOB: 1933/03/18, 88 y.o.   MRN: [id number] ? ?Chief Complaint  ?Patient presents with  ? Atrial Fibrillation  ? Follow-up  ?  1 year  ? ?HPI:   ? ?HPI: Dakota Scott  is a 88 y.o. male  peripheral arterial disease, hypertension, controlled diabetes mellitus with stage 3 CKD, peripheral neuropathy, hyperlipidemia and permanent atrial fibrillation.    ? ?Presents for annual follow-up on hypertension, dyspnea and atrial fibrillation.  He has remained stable and states that his dyspnea is stable, no PND or orthopnea.  He has chronic leg edema and since recent travel abroad, he has noticed slight increasing leg edema.  He is presently doing well, tolerating anticoagulation. ? ?Past Medical History:  ?Diagnosis Date  ? Acute hepatitis C without mention of hepatic coma(070.51)   ? Atrial fibrillation ([place]) 09/07/2018  ? Claudication, intermittent ([facility]) 04/25/2018  ? Diabetes mellitus without complication ([place])   ? Gout   ? Hyperlipidemia   ? Hypertension   ? Prostate cancer ([place])   ? ?Past Surgical History:  ?Procedure Laterality Date  ? EYE SURGERY  2009  ? cataract  ? ?Social History  ? ?Tobacco Use  ? Smoking status: Never  ? Smokeless tobacco: Never  ?Substance Use Topics  ? Alcohol use: Not Currently  ?Marital Status: Married  ? ?ROS  ?Review of Systems  ?Cardiovascular:  Positive for dyspnea on exertion. Negative for chest pain, claudication, leg swelling, orthopnea, palpitations and syncope.  ?Hematologic/Lymphatic: Does not bruise/bleed easily.  ?Musculoskeletal:  Positive for arthritis and back pain.  ?Gastrointestinal:  Negative for melena.  ?Genitourinary:  Positive for frequency.  ?Neurological:  Positive for numbness (feet) and sensory change.  ?Objective  ?Blood pressure 140/80, pulse 83, temperature 98 ?F (36.7 ?C), temperature source Temporal, resp. rate 16, height '5\' 6"'  (1.676 m), weight 193 lb 6.4 oz (87.7  kg), SpO2 94 %. Body mass index is 31.22 kg/m?. ?  ?Physical Exam ?Constitutional:   ?   Appearance: He is well-developed.  ?   Comments: Mildly obese  ?Neck:  ?   Thyroid: No thyromegaly.  ?   Vascular: No JVD.  ?Cardiovascular:  ?   Rate and Rhythm: Normal rate. Rhythm irregular.  ?   Pulses:     ?     Carotid pulses are 2+ on the right side and 2+ on the left side. ?     Radial pulses are 2+ on the right side and 2+ on the left side.  ?     Dorsalis pedis pulses are 0 on the right side and 0 on the left side.  ?     Posterior tibial pulses are 0 on the right side and 0 on the left side.  ?   Heart sounds: S1 normal and S2 normal. No murmur heard. ?  No gallop.  ?Pulmonary:  ?   Effort: Pulmonary effort is normal.  ?   Breath sounds: Normal breath sounds. No wheezing, rhonchi or rales.  ?Musculoskeletal:  ?   Right lower leg: Edema (2+) present.  ?   Left lower leg: Edema (2+) present.  ? ?Radiology: ?No results found. ? ?Laboratory examination:  ? ? ?External labs:  ? ?Lab 03/30/2020: ? ?Labs 05/10/2021: ? ?Total cholesterol 150, triglycerides 105, HDL 45, LDL 75. ? ?BUN 26, creatinine 1.67, EGFR 40 mL, potassium 4.5, LFTs normal. ? ?HPI 11.8/HCT 37.5, platelets 213, normal indicis. ? ?  A1c 6.4%.  TSH normal at 3.10.  UA normal. ? ?Total cholesterol 132, triglycerides 118, HDL 31, LDL 81.  Non-HDL cholesterol 101. ? ?Serum glucose 184, BUN 27, creatinine 1.51, potassium 4.3.  ? ?Medications  ? ?Current Outpatient Medications:  ?  acetaminophen (TYLENOL) 325 MG tablet, Take 650 mg by mouth every 6 (six) hours as needed., Disp: , Rfl:  ?  amLODipine (NORVASC) 5 MG tablet, Take 1 tablet (5 mg total) by mouth daily., Disp: 90 tablet, Rfl: 3 ?  amoxicillin-clavulanate (AUGMENTIN) 875-125 MG tablet, Take 1 tablet by mouth every 12 (twelve) hours., Disp: , Rfl:  ?  colchicine 0.6 MG tablet, At onset of gout flare take 2 tablets, may take 1 tablet 1 hr later if not relieved. May do this daily up to three days. (Patient  taking differently: Take 0.6 mg by mouth daily. At onset of gout flare take 2 tablets, may take 1 tablet 1 hr later if not relieved. May do this daily up to three days.), Disp: 30 tablet, Rfl: 1 ?  doxazosin (CARDURA) 2 MG tablet, Take 1 tablet (2 mg total) by mouth at bedtime. (Patient taking differently: Take 4 mg by mouth daily.), Disp: 30 tablet, Rfl: 0 ?  finasteride (PROSCAR) 5 MG tablet, Take 1 tablet by mouth daily., Disp: , Rfl:  ?  gabapentin (NEURONTIN) 400 MG capsule, Take 1 capsule by mouth in the morning and at bedtime., Disp: , Rfl:  ?  hydrALAZINE (APRESOLINE) 25 MG tablet, TAKE 1 TABLET BY MOUTH THREE TIMES DAILY, Disp: 90 tablet, Rfl: 1 ?  labetalol (NORMODYNE) 200 MG tablet, TAKE 1 TABLET BY MOUTH TWICE DAILY, Disp: 90 tablet, Rfl: 2 ?  losartan (COZAAR) 100 MG tablet, Take 1 tablet (100 mg total) by mouth daily., Disp: 90 tablet, Rfl: 3 ?  metFORMIN (GLUCOPHAGE-XR) 500 MG 24 hr tablet, Take 500 mg by mouth 2 (two) times daily. , Disp: , Rfl:  ?  pravastatin (PRAVACHOL) 20 MG tablet, Take 20 mg by mouth at bedtime. , Disp: , Rfl:  ?  torsemide (DEMADEX) 20 MG tablet, Take 1 tablet (20 mg total) by mouth every morning., Disp: 90 tablet, Rfl: 3 ?  XARELTO 15 MG TABS tablet, TAKE 1 TABLET BY MOUTH ONCE EVERY DAY, Disp: 90 tablet, Rfl: 2  ?  ?Cardiac Studies:  ? ?Echocardiogram 09/19/2019:  ?Hyperdynamic LV systolic function with visual EF >70%. Left ventricle cavity is normal in size. Severe left ventricular hypertrophy. Normal global wall motion. Unable to evaluate diastolic function due to atrial fibrillation. Elevated LAP. Calculated EF 50%.  ?Left atrial cavity is mildly dilated.  ?Right atrial cavity is mildly dilated.  ?Mild (Grade I) mitral regurgitation.  ?Moderate tricuspid regurgitation. Mild pulmonary hypertension. RVSP measures 37 mmHg.  ?IVC is dilated with respiratory variation.  ?No prior study for comparison. ? ?Lexiscan Sestamibi stress test 04/16/2018: ?1. Lexiscan stress test was  performed. Exercise capacity was not assessed. No stress symptoms reported. Resting blood pressure was 150/110 mmHg and peak effect blood pressure was 110/78 mmHg. The resting and stress electrocardiogram demonstrated atrial fibrillation, normal resting conduction, low voltage, and normal rest repolarization.  Stress EKG is non diagnostic for ischemia as it is a pharmacologic stress. ?2. The overall quality of the study is good. There is no evidence of abnormal lung activity. Stress and rest SPECT images demonstrate homogeneous tracer distribution throughout the myocardium. Gated SPECT imaging reveals normal myocardial thickening and wall motion. The left ventricular ejection fraction was normal (57%).   ?  3. Low risk study. ? ?Outside Echocardiogram 09/06/2013: Normal LV systolic function, mild LVH, grade 1 diastolic dysfunction. ? ?Lower extremity arterial duplex 12/01/2015: ?No hemodynamically significant stenoses are identified in thelower extremity arterial system. This exam reveals moderately decreased perfusion of the lower extremity, with RABI 0.76 and LABI 0.66 noted at the post tibial artery level. There is diffuse plaque and abnormal waveform in vessels below knee suggests small vessel disease. ? ?EKG: ? ?EKG 06/28/2021: Atrial fibrillation with controlled ventricular response at the rate of 78 bpm, leftward axis, incomplete right bundle branch block.  Poor R wave progression, cannot exclude anteroseptal infarct old.  Nonspecific T abnormality.  No significant change from 05/15/2020.  ? ?Assessment  ? ?Meds ordered this encounter  ?Medications  ? torsemide (DEMADEX) 20 MG tablet  ?  Sig: Take 1 tablet (20 mg total) by mouth every morning.  ?  Dispense:  90 tablet  ?  Refill:  3  ? ?  ICD-10-CM   ?1. Permanent atrial fibrillation (HCC)  I48.21 EKG 12-Lead  ?  ?2. Dyspnea on exertion  R06.09   ?  ?3. Essential hypertension  I10   ?  ?4. Stage 3a chronic kidney disease ([place])  N18.31   ?  ?5. Leg edema  R60.0  torsemide (DEMADEX) 20 MG tablet  ?  ? ?CHA2DS2-VASc Score is 4.  Yearly risk of stroke: 4.8% (A, HTN, DM).  Score of 1=0.6; 2=2.2; 3=3.2; 4=4.8; 5=7.2; 6=9.8; 7=>9.8) ?-(CHF; HTN; vasc disease DM,  Male = 1;

## 2021-07-21 DIAGNOSIS — Z9189 Other specified personal risk factors, not elsewhere classified: Secondary | ICD-10-CM | POA: Diagnosis not present

## 2021-07-21 DIAGNOSIS — I10 Essential (primary) hypertension: Secondary | ICD-10-CM | POA: Diagnosis not present

## 2021-07-21 DIAGNOSIS — R062 Wheezing: Secondary | ICD-10-CM | POA: Diagnosis not present

## 2021-07-21 DIAGNOSIS — J189 Pneumonia, unspecified organism: Secondary | ICD-10-CM | POA: Diagnosis not present

## 2021-07-21 DIAGNOSIS — J90 Pleural effusion, not elsewhere classified: Secondary | ICD-10-CM | POA: Diagnosis not present

## 2021-08-11 ENCOUNTER — Ambulatory Visit (INDEPENDENT_AMBULATORY_CARE_PROVIDER_SITE_OTHER): Payer: Medicare PPO | Admitting: Pulmonary Disease

## 2021-08-11 ENCOUNTER — Encounter: Payer: Self-pay | Admitting: Pulmonary Disease

## 2021-08-11 VITALS — BP 112/70 | HR 73 | Temp 98.0°F | Ht 66.0 in | Wt 184.2 lb

## 2021-08-11 DIAGNOSIS — R0683 Snoring: Secondary | ICD-10-CM | POA: Diagnosis not present

## 2021-08-11 DIAGNOSIS — R0609 Other forms of dyspnea: Secondary | ICD-10-CM

## 2021-08-11 NOTE — Patient Instructions (Signed)
Schedule you for an in lab polysomnogram  Schedule for echocardiogram  Schedule for pulmonary function test-can be done on the day of next visit  Graded exercises as tolerated Ensure you are taking as many steps as tolerated on a daily basis and try and improve as best as you can  Optimize weight management  I will see you back in about 3 months  Call with significant concerns  Sleep Apnea Sleep apnea affects breathing during sleep. It causes breathing to stop for 10 seconds or more, or to become shallow. People with sleep apnea usually snore loudly. It can also increase the risk of: Heart attack. Stroke. Being very overweight (obese). Diabetes. Heart failure. Irregular heartbeat. High blood pressure. The goal of treatment is to help you breathe normally again. What are the causes?  The most common cause of this condition is a collapsed or blocked airway. There are three kinds of sleep apnea: Obstructive sleep apnea. This is caused by a blocked or collapsed airway. Central sleep apnea. This happens when the brain does not send the right signals to the muscles that control breathing. Mixed sleep apnea. This is a combination of obstructive and central sleep apnea. What increases the risk? Being overweight. Smoking. Having a small airway. Being older. Being male. Drinking alcohol. Taking medicines to calm yourself (sedatives or tranquilizers). Having family members with the condition. Having a tongue or tonsils that are larger than normal. What are the signs or symptoms? Trouble staying asleep. Loud snoring. Headaches in the morning. Waking up gasping. Dry mouth or sore throat in the morning. Being sleepy or tired during the day. If you are sleepy or tired during the day, you may also: Not be able to focus your mind (concentrate). Forget things. Get angry a lot and have mood swings. Feel sad (depressed). Have changes in your personality. Have less interest in sex,  if you are male. Be unable to have an erection, if you are male. How is this treated?  Sleeping on your side. Using a medicine to get rid of mucus in your nose (decongestant). Avoiding the use of alcohol, medicines to help you relax, or certain pain medicines (narcotics). Losing weight, if needed. Changing your diet. Quitting smoking. Using a machine to open your airway while you sleep, such as: An oral appliance. This is a mouthpiece that shifts your lower jaw forward. A CPAP device. This device blows air through a mask when you breathe out (exhale). An EPAP device. This has valves that you put in each nostril. A BIPAP device. This device blows air through a mask when you breathe in (inhale) and breathe out. Having surgery if other treatments do not work. Follow these instructions at home: Lifestyle Make changes that your doctor recommends. Eat a healthy diet. Lose weight if needed. Avoid alcohol, medicines to help you relax, and some pain medicines. Do not smoke or use any products that contain nicotine or tobacco. If you need help quitting, ask your doctor. General instructions Take over-the-counter and prescription medicines only as told by your doctor. If you were given a machine to use while you sleep, use it only as told by your doctor. If you are having surgery, make sure to tell your doctor you have sleep apnea. You may need to bring your device with you. Keep all follow-up visits. Contact a doctor if: The machine that you were given to use during sleep bothers you or does not seem to be working. You do not get better. You get  worse. Get help right away if: Your chest hurts. You have trouble breathing in enough air. You have an uncomfortable feeling in your back, arms, or stomach. You have trouble talking. One side of your body feels weak. A part of your face is hanging down. These symptoms may be an emergency. Get help right away. Call your local emergency services  (911 in the [place] Do not wait to see if the symptoms will go away. Do not drive yourself to the hospital. Summary This condition affects breathing during sleep. The most common cause is a collapsed or blocked airway. The goal of treatment is to help you breathe normally while you sleep. This information is not intended to replace advice given to you by your health care provider. Make sure you discuss any questions you have with your health care provider. Document Revised: 09/16/2020 Document Reviewed: 01/17/2020 Elsevier Patient Education  [place].

## 2021-08-11 NOTE — Progress Notes (Signed)
Dakota Scott    [id number]    1933/05/16  Primary Care Physician:Osei-Bonsu, Iona Beard, MD  Referring Physician: Benito Mccreedy, MD [address],  [place] [address]  Chief complaint:   Patient being seen for witnessed apneas, nonrestorative sleep   HPI: patient with daytime sleepiness Nonrestorative sleep Witnessed apneas both at night and during the day  Usually goes to bed about 9 PM Falls asleep quickly About 2 awakenings Final wake up time between 6 and 7 AM  History of snoring History of witnessed apneas  He does have daytime fatigue and tiredness  Shortness of breath with activity He does have a history of hypertension, atrial fibrillation, diabetes, peripheral neuropathy, history of prostate cancer Chronic kidney disease  Past echo does reveal presence of pulmonary hypertension  Has been on water pills for a few years  He does have peripheral edema Was recently treated for pleural effusions   Never smoker Was a Pharmacist, hospital, office work No other significant predisposition to lung disease   Outpatient Encounter Medications as of 08/11/2021  Medication Sig  . acetaminophen (TYLENOL) 325 MG tablet Take 650 mg by mouth every 6 (six) hours as needed.  Marland Kitchen amLODipine (NORVASC) 5 MG tablet Take 1 tablet (5 mg total) by mouth daily.  . colchicine 0.6 MG tablet At onset of gout flare take 2 tablets, may take 1 tablet 1 hr later if not relieved. May do this daily up to three days. (Patient taking differently: Take 0.6 mg by mouth daily. At onset of gout flare take 2 tablets, may take 1 tablet 1 hr later if not relieved. May do this daily up to three days.)  . doxazosin (CARDURA) 2 MG tablet Take 1 tablet (2 mg total) by mouth at bedtime. (Patient taking differently: Take 4 mg by mouth daily.)  . finasteride (PROSCAR) 5 MG tablet Take 1 tablet by mouth daily.  Marland Kitchen gabapentin (NEURONTIN) 400 MG capsule Take 1 capsule by mouth in the morning  and at bedtime.  . hydrALAZINE (APRESOLINE) 25 MG tablet TAKE 1 TABLET BY MOUTH THREE TIMES DAILY  . labetalol (NORMODYNE) 200 MG tablet TAKE 1 TABLET BY MOUTH TWICE DAILY  . losartan (COZAAR) 100 MG tablet Take 1 tablet (100 mg total) by mouth daily.  . metFORMIN (GLUCOPHAGE-XR) 500 MG 24 hr tablet Take 500 mg by mouth 2 (two) times daily.   . pravastatin (PRAVACHOL) 20 MG tablet Take 20 mg by mouth at bedtime.   . torsemide (DEMADEX) 20 MG tablet Take 1 tablet (20 mg total) by mouth every morning.  Alveda Reasons 15 MG TABS tablet TAKE 1 TABLET BY MOUTH ONCE EVERY DAY  . [DISCONTINUED] amoxicillin-clavulanate (AUGMENTIN) 875-125 MG tablet Take 1 tablet by mouth every 12 (twelve) hours. (Patient not taking: Reported on 08/11/2021)   No facility-administered encounter medications on file as of 08/11/2021.    Allergies as of 08/11/2021  . (No Known Allergies)    Past Medical History:  Diagnosis Date  . Acute hepatitis C without mention of hepatic coma(070.51)   . Atrial fibrillation ([place]) 09/07/2018  . Claudication, intermittent ([place]) 04/25/2018  . Diabetes mellitus without complication ([place])   . Gout   . Hyperlipidemia   . Hypertension   . Prostate cancer [facility])     Past Surgical History:  Procedure Laterality Date  . EYE SURGERY  2009   cataract    Family History  Problem Relation Age of Onset  . Other  Brother   . Hyperlipidemia Neg Hx   . Heart disease Neg Hx   . Diabetes Neg Hx   . Cancer Neg Hx        colon, lung, and prostate cancer    Social History   Socioeconomic History  . Marital status: Married    Spouse name: Not on file  . Number of children: 7  . Years of education: Not on file  . Highest education level: Not on file  Occupational History  . Not on file  Tobacco Use  . Smoking status: Never  . Smokeless tobacco: Never  Vaping Use  . Vaping Use: Never used  Substance and Sexual Activity  . Alcohol use: Not Currently  . Drug use: Never  . Sexual  activity: Not Currently  Other Topics Concern  . Not on file  Social History Narrative   Native of [place]   Moved to [place] '98   Lives with daughter    Right Handed   Drinks no caffeine   Social Determinants of Health   Financial Resource Strain: Not on file  Food Insecurity: Not on file  Transportation Needs: Not on file  Physical Activity: Not on file  Stress: Not on file  Social Connections: Not on file  Intimate Partner Violence: Not on file    Review of Systems  Constitutional:  Positive for fatigue.  Psychiatric/Behavioral:  Positive for sleep disturbance.     Vitals:   08/11/21 0952  BP: 112/70  Pulse: 73  Temp: 98 F (36.7 C)  SpO2: 100%     Physical Exam Constitutional:      Appearance: He is obese.  HENT:     Head: Normocephalic.     Mouth/Throat:     Mouth: Mucous membranes are moist.     Comments: Mallampati 3, crowded oropharynx Cardiovascular:     Rate and Rhythm: Normal rate and regular rhythm.     Heart sounds: No murmur heard.    No friction rub.  Pulmonary:     Effort: No respiratory distress.     Breath sounds: No stridor. No wheezing or rhonchi.  Musculoskeletal:     Cervical back: No rigidity or tenderness.  Neurological:     Mental Status: He is alert.  Psychiatric:        Mood and Affect: Mood normal.      08/11/2021    9:00 AM  Results of the Epworth flowsheet  Sitting and reading 0  Watching TV 3  Sitting, inactive in a public place (e.g. a theatre or a meeting) 0  As a passenger in a car for an hour without a break 1  Lying down to rest in the afternoon when circumstances permit 1  Sitting and talking to someone 0  Sitting quietly after a lunch without alcohol 3  In a car, while stopped for a few minutes in traffic 0  Total score 8    Data Reviewed: No previous sleep study available  Last echocardiogram was in 2021, severe left ventricular hypertrophy, elevated left atrial pressures, mildly dilated left atrial cavity,  grade 1 mitral regurg, moderate tricuspid regurg with mild pulmonary hypertension  Assessment:  Excessive daytime sleepiness  Moderate risk of obstructive sleep apnea, possibility of central sleep apnea  Mild pulmonary hypertension  Pathophysiology of sleep disordered breathing discussed with the patient Treatment options for sleep disordered breathing discussed with the patient  Importance of weight management discussed with the patient  Importance of graded exercise discussed with the  patient  Plan/Recommendations: Graded activities as tolerated  Regular walking  Schedule patient for an in lab polysomnogram  Schedule patient for pulmonary function test  Schedule patient for an echocardiogram  Tentative follow-up in 3 months  Encouraged to call with any significant concerns  I spent 45 minutes dedicated to the care of this patient on the date of this encounter to include previsit review of records, face-to-face time with the patient discussing conditions above, post visit ordering of testing, clinical documentation with electronic health record, making appropriate referrals as documented, and communicated necessary findings to members of the patient's care team  Sherrilyn Rist MD [facility] 08/11/2021, 10:42 AM  CC: Benito Mccreedy, MD

## 2021-08-17 DIAGNOSIS — I10 Essential (primary) hypertension: Secondary | ICD-10-CM | POA: Diagnosis not present

## 2021-08-17 DIAGNOSIS — J90 Pleural effusion, not elsewhere classified: Secondary | ICD-10-CM | POA: Diagnosis not present

## 2021-08-17 DIAGNOSIS — M109 Gout, unspecified: Secondary | ICD-10-CM | POA: Diagnosis not present

## 2021-08-17 DIAGNOSIS — E1165 Type 2 diabetes mellitus with hyperglycemia: Secondary | ICD-10-CM | POA: Diagnosis not present

## 2021-08-17 DIAGNOSIS — E782 Mixed hyperlipidemia: Secondary | ICD-10-CM | POA: Diagnosis not present

## 2021-09-14 ENCOUNTER — Ambulatory Visit (HOSPITAL_COMMUNITY)
Admission: RE | Admit: 2021-09-14 | Discharge: 2021-09-14 | Disposition: A | Discharge status: Home / Self Care | Payer: Medicare PPO | Source: Ambulatory Visit | Attending: Pulmonary Disease | Admitting: Pulmonary Disease

## 2021-09-14 DIAGNOSIS — I517 Cardiomegaly: Secondary | ICD-10-CM | POA: Insufficient documentation

## 2021-09-14 DIAGNOSIS — R0609 Other forms of dyspnea: Secondary | ICD-10-CM | POA: Diagnosis not present

## 2021-09-14 LAB — ECHOCARDIOGRAM COMPLETE
AR max vel: 1.75 cm2
AV Peak grad: 5.9 mmHg
Ao pk vel: 1.22 m/s
Area-P 1/2: 5.16 cm2
S' Lateral: 1.9 cm

## 2021-10-05 ENCOUNTER — Ambulatory Visit (HOSPITAL_BASED_OUTPATIENT_CLINIC_OR_DEPARTMENT_OTHER): Payer: Medicare PPO | Admitting: Pulmonary Disease

## 2021-10-11 ENCOUNTER — Other Ambulatory Visit: Payer: Self-pay | Admitting: Cardiology

## 2021-10-11 DIAGNOSIS — I4821 Permanent atrial fibrillation: Secondary | ICD-10-CM

## 2021-11-15 DIAGNOSIS — R601 Generalized edema: Secondary | ICD-10-CM | POA: Diagnosis not present

## 2021-11-15 DIAGNOSIS — M109 Gout, unspecified: Secondary | ICD-10-CM | POA: Diagnosis not present

## 2021-11-15 DIAGNOSIS — I10 Essential (primary) hypertension: Secondary | ICD-10-CM | POA: Diagnosis not present

## 2021-11-15 DIAGNOSIS — E1165 Type 2 diabetes mellitus with hyperglycemia: Secondary | ICD-10-CM | POA: Diagnosis not present

## 2021-11-15 DIAGNOSIS — E782 Mixed hyperlipidemia: Secondary | ICD-10-CM | POA: Diagnosis not present

## 2021-11-15 DIAGNOSIS — J90 Pleural effusion, not elsewhere classified: Secondary | ICD-10-CM | POA: Diagnosis not present

## 2021-12-23 DIAGNOSIS — N401 Enlarged prostate with lower urinary tract symptoms: Secondary | ICD-10-CM | POA: Diagnosis not present

## 2021-12-23 DIAGNOSIS — R351 Nocturia: Secondary | ICD-10-CM | POA: Diagnosis not present

## 2021-12-23 DIAGNOSIS — C61 Malignant neoplasm of prostate: Secondary | ICD-10-CM | POA: Diagnosis not present

## 2021-12-27 DIAGNOSIS — I10 Essential (primary) hypertension: Secondary | ICD-10-CM | POA: Diagnosis not present

## 2021-12-27 DIAGNOSIS — M1A0710 Idiopathic chronic gout, right ankle and foot, without tophus (tophi): Secondary | ICD-10-CM | POA: Diagnosis not present

## 2021-12-27 DIAGNOSIS — E1165 Type 2 diabetes mellitus with hyperglycemia: Secondary | ICD-10-CM | POA: Diagnosis not present

## 2021-12-27 DIAGNOSIS — Z0001 Encounter for general adult medical examination with abnormal findings: Secondary | ICD-10-CM | POA: Diagnosis not present

## 2021-12-27 DIAGNOSIS — R601 Generalized edema: Secondary | ICD-10-CM | POA: Diagnosis not present

## 2021-12-27 DIAGNOSIS — N522 Drug-induced erectile dysfunction: Secondary | ICD-10-CM | POA: Diagnosis not present

## 2021-12-27 DIAGNOSIS — E782 Mixed hyperlipidemia: Secondary | ICD-10-CM | POA: Diagnosis not present

## 2022-03-11 DIAGNOSIS — E119 Type 2 diabetes mellitus without complications: Secondary | ICD-10-CM | POA: Diagnosis not present

## 2022-03-11 DIAGNOSIS — H401132 Primary open-angle glaucoma, bilateral, moderate stage: Secondary | ICD-10-CM | POA: Diagnosis not present

## 2022-04-01 DIAGNOSIS — H04123 Dry eye syndrome of bilateral lacrimal glands: Secondary | ICD-10-CM | POA: Diagnosis not present

## 2022-04-01 DIAGNOSIS — H401132 Primary open-angle glaucoma, bilateral, moderate stage: Secondary | ICD-10-CM | POA: Diagnosis not present

## 2022-05-03 DIAGNOSIS — E1165 Type 2 diabetes mellitus with hyperglycemia: Secondary | ICD-10-CM | POA: Diagnosis not present

## 2022-05-03 DIAGNOSIS — Z125 Encounter for screening for malignant neoplasm of prostate: Secondary | ICD-10-CM | POA: Diagnosis not present

## 2022-05-03 DIAGNOSIS — I10 Essential (primary) hypertension: Secondary | ICD-10-CM | POA: Diagnosis not present

## 2022-05-03 DIAGNOSIS — Z0001 Encounter for general adult medical examination with abnormal findings: Secondary | ICD-10-CM | POA: Diagnosis not present

## 2022-05-03 DIAGNOSIS — R7989 Other specified abnormal findings of blood chemistry: Secondary | ICD-10-CM | POA: Diagnosis not present

## 2022-05-03 DIAGNOSIS — R5381 Other malaise: Secondary | ICD-10-CM | POA: Diagnosis not present

## 2022-05-03 DIAGNOSIS — R0609 Other forms of dyspnea: Secondary | ICD-10-CM | POA: Diagnosis not present

## 2022-05-03 DIAGNOSIS — I509 Heart failure, unspecified: Secondary | ICD-10-CM | POA: Diagnosis not present

## 2022-05-03 DIAGNOSIS — R600 Localized edema: Secondary | ICD-10-CM | POA: Diagnosis not present

## 2022-05-03 DIAGNOSIS — M2550 Pain in unspecified joint: Secondary | ICD-10-CM | POA: Diagnosis not present

## 2022-05-03 DIAGNOSIS — M71571 Other bursitis, not elsewhere classified, right ankle and foot: Secondary | ICD-10-CM | POA: Diagnosis not present

## 2022-05-03 DIAGNOSIS — N522 Drug-induced erectile dysfunction: Secondary | ICD-10-CM | POA: Diagnosis not present

## 2022-05-03 DIAGNOSIS — M1A0710 Idiopathic chronic gout, right ankle and foot, without tophus (tophi): Secondary | ICD-10-CM | POA: Diagnosis not present

## 2022-05-03 DIAGNOSIS — E782 Mixed hyperlipidemia: Secondary | ICD-10-CM | POA: Diagnosis not present

## 2022-05-17 DIAGNOSIS — M1A0710 Idiopathic chronic gout, right ankle and foot, without tophus (tophi): Secondary | ICD-10-CM | POA: Diagnosis not present

## 2022-05-17 DIAGNOSIS — M2550 Pain in unspecified joint: Secondary | ICD-10-CM | POA: Diagnosis not present

## 2022-05-17 DIAGNOSIS — E1140 Type 2 diabetes mellitus with diabetic neuropathy, unspecified: Secondary | ICD-10-CM | POA: Diagnosis not present

## 2022-05-17 DIAGNOSIS — N1832 Chronic kidney disease, stage 3b: Secondary | ICD-10-CM | POA: Diagnosis not present

## 2022-05-17 DIAGNOSIS — I480 Paroxysmal atrial fibrillation: Secondary | ICD-10-CM | POA: Diagnosis not present

## 2022-05-17 DIAGNOSIS — E1122 Type 2 diabetes mellitus with diabetic chronic kidney disease: Secondary | ICD-10-CM | POA: Diagnosis not present

## 2022-05-17 DIAGNOSIS — R600 Localized edema: Secondary | ICD-10-CM | POA: Diagnosis not present

## 2022-05-17 DIAGNOSIS — R5381 Other malaise: Secondary | ICD-10-CM | POA: Diagnosis not present

## 2022-05-17 DIAGNOSIS — I1310 Hypertensive heart and chronic kidney disease without heart failure, with stage 1 through stage 4 chronic kidney disease, or unspecified chronic kidney disease: Secondary | ICD-10-CM | POA: Diagnosis not present

## 2022-05-18 DIAGNOSIS — M1A0710 Idiopathic chronic gout, right ankle and foot, without tophus (tophi): Secondary | ICD-10-CM | POA: Diagnosis not present

## 2022-05-18 DIAGNOSIS — R5381 Other malaise: Secondary | ICD-10-CM | POA: Diagnosis not present

## 2022-05-18 DIAGNOSIS — M2550 Pain in unspecified joint: Secondary | ICD-10-CM | POA: Diagnosis not present

## 2022-05-18 DIAGNOSIS — R600 Localized edema: Secondary | ICD-10-CM | POA: Diagnosis not present

## 2022-05-18 DIAGNOSIS — R0609 Other forms of dyspnea: Secondary | ICD-10-CM | POA: Diagnosis not present

## 2022-05-18 DIAGNOSIS — I480 Paroxysmal atrial fibrillation: Secondary | ICD-10-CM | POA: Diagnosis not present

## 2022-05-25 DIAGNOSIS — R5381 Other malaise: Secondary | ICD-10-CM | POA: Diagnosis not present

## 2022-05-25 DIAGNOSIS — I480 Paroxysmal atrial fibrillation: Secondary | ICD-10-CM | POA: Diagnosis not present

## 2022-05-25 DIAGNOSIS — N1832 Chronic kidney disease, stage 3b: Secondary | ICD-10-CM | POA: Diagnosis not present

## 2022-05-25 DIAGNOSIS — R600 Localized edema: Secondary | ICD-10-CM | POA: Diagnosis not present

## 2022-05-25 DIAGNOSIS — E1140 Type 2 diabetes mellitus with diabetic neuropathy, unspecified: Secondary | ICD-10-CM | POA: Diagnosis not present

## 2022-05-25 DIAGNOSIS — E1122 Type 2 diabetes mellitus with diabetic chronic kidney disease: Secondary | ICD-10-CM | POA: Diagnosis not present

## 2022-05-25 DIAGNOSIS — M1A0710 Idiopathic chronic gout, right ankle and foot, without tophus (tophi): Secondary | ICD-10-CM | POA: Diagnosis not present

## 2022-05-25 DIAGNOSIS — I1310 Hypertensive heart and chronic kidney disease without heart failure, with stage 1 through stage 4 chronic kidney disease, or unspecified chronic kidney disease: Secondary | ICD-10-CM | POA: Diagnosis not present

## 2022-05-25 DIAGNOSIS — M2550 Pain in unspecified joint: Secondary | ICD-10-CM | POA: Diagnosis not present

## 2022-05-31 DIAGNOSIS — I480 Paroxysmal atrial fibrillation: Secondary | ICD-10-CM | POA: Diagnosis not present

## 2022-05-31 DIAGNOSIS — R5381 Other malaise: Secondary | ICD-10-CM | POA: Diagnosis not present

## 2022-05-31 DIAGNOSIS — I1310 Hypertensive heart and chronic kidney disease without heart failure, with stage 1 through stage 4 chronic kidney disease, or unspecified chronic kidney disease: Secondary | ICD-10-CM | POA: Diagnosis not present

## 2022-05-31 DIAGNOSIS — M2550 Pain in unspecified joint: Secondary | ICD-10-CM | POA: Diagnosis not present

## 2022-05-31 DIAGNOSIS — M1A0710 Idiopathic chronic gout, right ankle and foot, without tophus (tophi): Secondary | ICD-10-CM | POA: Diagnosis not present

## 2022-05-31 DIAGNOSIS — R600 Localized edema: Secondary | ICD-10-CM | POA: Diagnosis not present

## 2022-05-31 DIAGNOSIS — N1832 Chronic kidney disease, stage 3b: Secondary | ICD-10-CM | POA: Diagnosis not present

## 2022-05-31 DIAGNOSIS — E1122 Type 2 diabetes mellitus with diabetic chronic kidney disease: Secondary | ICD-10-CM | POA: Diagnosis not present

## 2022-05-31 DIAGNOSIS — E1140 Type 2 diabetes mellitus with diabetic neuropathy, unspecified: Secondary | ICD-10-CM | POA: Diagnosis not present

## 2022-06-02 DIAGNOSIS — N1832 Chronic kidney disease, stage 3b: Secondary | ICD-10-CM | POA: Diagnosis not present

## 2022-06-02 DIAGNOSIS — E1122 Type 2 diabetes mellitus with diabetic chronic kidney disease: Secondary | ICD-10-CM | POA: Diagnosis not present

## 2022-06-02 DIAGNOSIS — I1310 Hypertensive heart and chronic kidney disease without heart failure, with stage 1 through stage 4 chronic kidney disease, or unspecified chronic kidney disease: Secondary | ICD-10-CM | POA: Diagnosis not present

## 2022-06-02 DIAGNOSIS — M1A0710 Idiopathic chronic gout, right ankle and foot, without tophus (tophi): Secondary | ICD-10-CM | POA: Diagnosis not present

## 2022-06-02 DIAGNOSIS — R600 Localized edema: Secondary | ICD-10-CM | POA: Diagnosis not present

## 2022-06-02 DIAGNOSIS — R5381 Other malaise: Secondary | ICD-10-CM | POA: Diagnosis not present

## 2022-06-02 DIAGNOSIS — M2550 Pain in unspecified joint: Secondary | ICD-10-CM | POA: Diagnosis not present

## 2022-06-02 DIAGNOSIS — I480 Paroxysmal atrial fibrillation: Secondary | ICD-10-CM | POA: Diagnosis not present

## 2022-06-02 DIAGNOSIS — E1140 Type 2 diabetes mellitus with diabetic neuropathy, unspecified: Secondary | ICD-10-CM | POA: Diagnosis not present

## 2022-06-06 DIAGNOSIS — E1122 Type 2 diabetes mellitus with diabetic chronic kidney disease: Secondary | ICD-10-CM | POA: Diagnosis not present

## 2022-06-06 DIAGNOSIS — N1832 Chronic kidney disease, stage 3b: Secondary | ICD-10-CM | POA: Diagnosis not present

## 2022-06-06 DIAGNOSIS — M10041 Idiopathic gout, right hand: Secondary | ICD-10-CM | POA: Diagnosis not present

## 2022-06-06 DIAGNOSIS — I1310 Hypertensive heart and chronic kidney disease without heart failure, with stage 1 through stage 4 chronic kidney disease, or unspecified chronic kidney disease: Secondary | ICD-10-CM | POA: Diagnosis not present

## 2022-06-06 DIAGNOSIS — E1140 Type 2 diabetes mellitus with diabetic neuropathy, unspecified: Secondary | ICD-10-CM | POA: Diagnosis not present

## 2022-06-09 DIAGNOSIS — R5381 Other malaise: Secondary | ICD-10-CM | POA: Diagnosis not present

## 2022-06-09 DIAGNOSIS — R600 Localized edema: Secondary | ICD-10-CM | POA: Diagnosis not present

## 2022-06-09 DIAGNOSIS — N1832 Chronic kidney disease, stage 3b: Secondary | ICD-10-CM | POA: Diagnosis not present

## 2022-06-09 DIAGNOSIS — M1A0710 Idiopathic chronic gout, right ankle and foot, without tophus (tophi): Secondary | ICD-10-CM | POA: Diagnosis not present

## 2022-06-09 DIAGNOSIS — I480 Paroxysmal atrial fibrillation: Secondary | ICD-10-CM | POA: Diagnosis not present

## 2022-06-09 DIAGNOSIS — E1140 Type 2 diabetes mellitus with diabetic neuropathy, unspecified: Secondary | ICD-10-CM | POA: Diagnosis not present

## 2022-06-09 DIAGNOSIS — E1122 Type 2 diabetes mellitus with diabetic chronic kidney disease: Secondary | ICD-10-CM | POA: Diagnosis not present

## 2022-06-09 DIAGNOSIS — M2550 Pain in unspecified joint: Secondary | ICD-10-CM | POA: Diagnosis not present

## 2022-06-09 DIAGNOSIS — I1310 Hypertensive heart and chronic kidney disease without heart failure, with stage 1 through stage 4 chronic kidney disease, or unspecified chronic kidney disease: Secondary | ICD-10-CM | POA: Diagnosis not present

## 2022-06-13 ENCOUNTER — Other Ambulatory Visit: Payer: Self-pay | Admitting: Cardiology

## 2022-06-13 DIAGNOSIS — R600 Localized edema: Secondary | ICD-10-CM

## 2022-06-17 DIAGNOSIS — I1310 Hypertensive heart and chronic kidney disease without heart failure, with stage 1 through stage 4 chronic kidney disease, or unspecified chronic kidney disease: Secondary | ICD-10-CM | POA: Diagnosis not present

## 2022-06-17 DIAGNOSIS — R5381 Other malaise: Secondary | ICD-10-CM | POA: Diagnosis not present

## 2022-06-17 DIAGNOSIS — R600 Localized edema: Secondary | ICD-10-CM | POA: Diagnosis not present

## 2022-06-17 DIAGNOSIS — I480 Paroxysmal atrial fibrillation: Secondary | ICD-10-CM | POA: Diagnosis not present

## 2022-06-17 DIAGNOSIS — M2550 Pain in unspecified joint: Secondary | ICD-10-CM | POA: Diagnosis not present

## 2022-06-17 DIAGNOSIS — E1122 Type 2 diabetes mellitus with diabetic chronic kidney disease: Secondary | ICD-10-CM | POA: Diagnosis not present

## 2022-06-17 DIAGNOSIS — M1A0710 Idiopathic chronic gout, right ankle and foot, without tophus (tophi): Secondary | ICD-10-CM | POA: Diagnosis not present

## 2022-06-17 DIAGNOSIS — E1140 Type 2 diabetes mellitus with diabetic neuropathy, unspecified: Secondary | ICD-10-CM | POA: Diagnosis not present

## 2022-06-17 DIAGNOSIS — N1832 Chronic kidney disease, stage 3b: Secondary | ICD-10-CM | POA: Diagnosis not present

## 2022-06-20 DIAGNOSIS — M2550 Pain in unspecified joint: Secondary | ICD-10-CM | POA: Diagnosis not present

## 2022-06-20 DIAGNOSIS — I1310 Hypertensive heart and chronic kidney disease without heart failure, with stage 1 through stage 4 chronic kidney disease, or unspecified chronic kidney disease: Secondary | ICD-10-CM | POA: Diagnosis not present

## 2022-06-20 DIAGNOSIS — M10041 Idiopathic gout, right hand: Secondary | ICD-10-CM | POA: Diagnosis not present

## 2022-06-20 DIAGNOSIS — E1122 Type 2 diabetes mellitus with diabetic chronic kidney disease: Secondary | ICD-10-CM | POA: Diagnosis not present

## 2022-06-20 DIAGNOSIS — M1A0710 Idiopathic chronic gout, right ankle and foot, without tophus (tophi): Secondary | ICD-10-CM | POA: Diagnosis not present

## 2022-06-20 DIAGNOSIS — R600 Localized edema: Secondary | ICD-10-CM | POA: Diagnosis not present

## 2022-06-20 DIAGNOSIS — E1140 Type 2 diabetes mellitus with diabetic neuropathy, unspecified: Secondary | ICD-10-CM | POA: Diagnosis not present

## 2022-06-20 DIAGNOSIS — I10 Essential (primary) hypertension: Secondary | ICD-10-CM | POA: Diagnosis not present

## 2022-06-20 DIAGNOSIS — I480 Paroxysmal atrial fibrillation: Secondary | ICD-10-CM | POA: Diagnosis not present

## 2022-06-20 DIAGNOSIS — N1832 Chronic kidney disease, stage 3b: Secondary | ICD-10-CM | POA: Diagnosis not present

## 2022-06-20 DIAGNOSIS — R5381 Other malaise: Secondary | ICD-10-CM | POA: Diagnosis not present

## 2022-06-23 DIAGNOSIS — R600 Localized edema: Secondary | ICD-10-CM | POA: Diagnosis not present

## 2022-06-23 DIAGNOSIS — R5381 Other malaise: Secondary | ICD-10-CM | POA: Diagnosis not present

## 2022-06-23 DIAGNOSIS — E1122 Type 2 diabetes mellitus with diabetic chronic kidney disease: Secondary | ICD-10-CM | POA: Diagnosis not present

## 2022-06-23 DIAGNOSIS — M1A0710 Idiopathic chronic gout, right ankle and foot, without tophus (tophi): Secondary | ICD-10-CM | POA: Diagnosis not present

## 2022-06-23 DIAGNOSIS — M2550 Pain in unspecified joint: Secondary | ICD-10-CM | POA: Diagnosis not present

## 2022-06-23 DIAGNOSIS — N1832 Chronic kidney disease, stage 3b: Secondary | ICD-10-CM | POA: Diagnosis not present

## 2022-06-23 DIAGNOSIS — I480 Paroxysmal atrial fibrillation: Secondary | ICD-10-CM | POA: Diagnosis not present

## 2022-06-23 DIAGNOSIS — I1310 Hypertensive heart and chronic kidney disease without heart failure, with stage 1 through stage 4 chronic kidney disease, or unspecified chronic kidney disease: Secondary | ICD-10-CM | POA: Diagnosis not present

## 2022-06-23 DIAGNOSIS — E1140 Type 2 diabetes mellitus with diabetic neuropathy, unspecified: Secondary | ICD-10-CM | POA: Diagnosis not present

## 2022-06-28 DIAGNOSIS — R5381 Other malaise: Secondary | ICD-10-CM | POA: Diagnosis not present

## 2022-06-28 DIAGNOSIS — M2550 Pain in unspecified joint: Secondary | ICD-10-CM | POA: Diagnosis not present

## 2022-06-28 DIAGNOSIS — M1A0710 Idiopathic chronic gout, right ankle and foot, without tophus (tophi): Secondary | ICD-10-CM | POA: Diagnosis not present

## 2022-06-28 DIAGNOSIS — E1122 Type 2 diabetes mellitus with diabetic chronic kidney disease: Secondary | ICD-10-CM | POA: Diagnosis not present

## 2022-06-28 DIAGNOSIS — N1832 Chronic kidney disease, stage 3b: Secondary | ICD-10-CM | POA: Diagnosis not present

## 2022-06-28 DIAGNOSIS — E1140 Type 2 diabetes mellitus with diabetic neuropathy, unspecified: Secondary | ICD-10-CM | POA: Diagnosis not present

## 2022-06-28 DIAGNOSIS — R600 Localized edema: Secondary | ICD-10-CM | POA: Diagnosis not present

## 2022-06-28 DIAGNOSIS — I1310 Hypertensive heart and chronic kidney disease without heart failure, with stage 1 through stage 4 chronic kidney disease, or unspecified chronic kidney disease: Secondary | ICD-10-CM | POA: Diagnosis not present

## 2022-06-28 DIAGNOSIS — I480 Paroxysmal atrial fibrillation: Secondary | ICD-10-CM | POA: Diagnosis not present

## 2022-06-29 ENCOUNTER — Ambulatory Visit: Payer: Self-pay | Admitting: Cardiology

## 2022-06-29 NOTE — Progress Notes (Signed)
No SHOW    Primary Physician/Referring:  Jackie Plum, MD  Patient ID: Dakota Scott, male    DOB: 1934/01/24, 89 y.o.   MRN: [id number]  No chief complaint on file.  HPI:    HPI: Dakota Scott  is a 89 y.o. male  peripheral arterial disease, hypertension, controlled diabetes mellitus with stage 3 CKD, peripheral neuropathy, hyperlipidemia and permanent atrial fibrillation.     Presents for annual follow-up on hypertension, dyspnea and atrial fibrillation.  He has remained stable and states that his dyspnea is stable, no PND or orthopnea.  He has chronic leg edema and since recent travel abroad, he has noticed slight increasing leg edema.  He is presently doing well, tolerating anticoagulation.  Past Medical History:  Diagnosis Date   Acute hepatitis C without mention of hepatic coma(070.51)    Atrial fibrillation (HCC) 09/07/2018   Claudication, intermittent (HCC) 04/25/2018   Diabetes mellitus without complication (HCC)    Gout    Hyperlipidemia    Hypertension    Prostate cancer [facility])    Past Surgical History:  Procedure Laterality Date   EYE SURGERY  2009   cataract   Social History   Tobacco Use   Smoking status: Never   Smokeless tobacco: Never  Substance Use Topics   Alcohol use: Not Currently  Marital Status: Married   ROS  Review of Systems  Cardiovascular:  Positive for dyspnea on exertion. Negative for chest pain, claudication, leg swelling, orthopnea, palpitations and syncope.  Hematologic/Lymphatic: Does not bruise/bleed easily.  Musculoskeletal:  Positive for arthritis and back pain.  Gastrointestinal:  Negative for melena.  Genitourinary:  Positive for frequency.  Neurological:  Positive for numbness (feet) and sensory change.   Objective  There were no vitals taken for this visit. There is no height or weight on file to calculate BMI.   Physical Exam Constitutional:      Appearance: He is well-developed.     Comments: Mildly obese   Neck:     Thyroid: No thyromegaly.     Vascular: No JVD.  Cardiovascular:     Rate and Rhythm: Normal rate. Rhythm irregular.     Pulses:          Carotid pulses are 2+ on the right side and 2+ on the left side.      Radial pulses are 2+ on the right side and 2+ on the left side.       Dorsalis pedis pulses are 0 on the right side and 0 on the left side.       Posterior tibial pulses are 0 on the right side and 0 on the left side.     Heart sounds: S1 normal and S2 normal. No murmur heard.    No gallop.  Pulmonary:     Effort: Pulmonary effort is normal.     Breath sounds: Normal breath sounds. No wheezing, rhonchi or rales.  Musculoskeletal:     Right lower leg: Edema (2+) present.     Left lower leg: Edema (2+) present.    Radiology: No results found.  Laboratory examination:    External labs:   Labs 05/06/2022:  Total cholesterol 92, triglycerides 78, HDL 33, LDL 43.  Non-HDL cholesterol 59.  Serum glucose 153 mg, BUN 23, creatinine 1.63, EGFR 41 mL, potassium 4.3, LFTs normal.  A1c 7.0%.  Labs 11/11/2021:  Total cholesterol 161, triglycerides 194, HDL 35, LDL 97.  Non-HDL cholesterol 126.  Serum glucose 105 mg, BUN 25, creatinine  1.63, LFTs normal.  Urinary albumin to creatinine ratio 127.  Hb 13.5/HCT 39.7, platelets 217.  A1c 6.9%.  TSH normal at 2.77.  Cardiac Studies:   Echocardiogram 09/19/2019:  Hyperdynamic LV systolic function with visual EF >70%. Left ventricle cavity is normal in size. Severe left ventricular hypertrophy. Normal global wall motion. Unable to evaluate diastolic function due to atrial fibrillation. Elevated LAP. Calculated EF 50%.  Left atrial cavity is mildly dilated.  Right atrial cavity is mildly dilated.  Mild (Grade I) mitral regurgitation.  Moderate tricuspid regurgitation. Mild pulmonary hypertension. RVSP measures 37 mmHg.  IVC is dilated with respiratory variation.  No prior study for comparison.  Lexiscan Sestamibi  stress test 04/16/2018: 1. Lexiscan stress test was performed. Exercise capacity was not assessed. No stress symptoms reported. Resting blood pressure was 150/110 mmHg and peak effect blood pressure was 110/78 mmHg. The resting and stress electrocardiogram demonstrated atrial fibrillation, normal resting conduction, low voltage, and normal rest repolarization.  Stress EKG is non diagnostic for ischemia as it is a pharmacologic stress. 2. The overall quality of the study is good. There is no evidence of abnormal lung activity. Stress and rest SPECT images demonstrate homogeneous tracer distribution throughout the myocardium. Gated SPECT imaging reveals normal myocardial thickening and wall motion. The left ventricular ejection fraction was normal (57%).   3. Low risk study.  Outside Echocardiogram 09/06/2013: Normal LV systolic function, mild LVH, grade 1 diastolic dysfunction.  Lower extremity arterial duplex 12/01/2015: No hemodynamically significant stenoses are identified in thelower extremity arterial system. This exam reveals moderately decreased perfusion of the lower extremity, with RABI 0.76 and LABI 0.66 noted at the post tibial artery level. There is diffuse plaque and abnormal waveform in vessels below knee suggests small vessel disease.  EKG:   EKG 06/28/2021: Atrial fibrillation with controlled ventricular response at the rate of 78 bpm, leftward axis, incomplete right bundle branch block.  Poor R wave progression, cannot exclude anteroseptal infarct old.  Nonspecific T abnormality.  No significant change from 05/15/2020.    Allergies & Medications   No Known Allergies  Current Outpatient Medications:    acetaminophen (TYLENOL) 325 MG tablet, Take 650 mg by mouth every 6 (six) hours as needed., Disp: , Rfl:    amLODipine (NORVASC) 5 MG tablet, Take 1 tablet (5 mg total) by mouth daily., Disp: 90 tablet, Rfl: 3   colchicine 0.6 MG tablet, At onset of gout flare take 2 tablets, may take  1 tablet 1 hr later if not relieved. May do this daily up to three days. (Patient taking differently: Take 0.6 mg by mouth daily. At onset of gout flare take 2 tablets, may take 1 tablet 1 hr later if not relieved. May do this daily up to three days.), Disp: 30 tablet, Rfl: 1   doxazosin (CARDURA) 2 MG tablet, Take 1 tablet (2 mg total) by mouth at bedtime. (Patient taking differently: Take 4 mg by mouth daily.), Disp: 30 tablet, Rfl: 0   finasteride (PROSCAR) 5 MG tablet, Take 1 tablet by mouth daily., Disp: , Rfl:    gabapentin (NEURONTIN) 400 MG capsule, Take 1 capsule by mouth in the morning and at bedtime., Disp: , Rfl:    hydrALAZINE (APRESOLINE) 25 MG tablet, TAKE 1 TABLET BY MOUTH THREE TIMES DAILY, Disp: 90 tablet, Rfl: 1   labetalol (NORMODYNE) 200 MG tablet, TAKE 1 TABLET BY MOUTH TWICE DAILY, Disp: 90 tablet, Rfl: 2   losartan (COZAAR) 100 MG tablet, Take 1 tablet (100 mg  total) by mouth daily., Disp: 90 tablet, Rfl: 3   metFORMIN (GLUCOPHAGE-XR) 500 MG 24 hr tablet, Take 500 mg by mouth 2 (two) times daily. , Disp: , Rfl:    pravastatin (PRAVACHOL) 20 MG tablet, Take 20 mg by mouth at bedtime. , Disp: , Rfl:    torsemide (DEMADEX) 20 MG tablet, TAKE ONE TABLET BY MOUTH EVERY MORNING, Disp: 30 tablet, Rfl: 0   XARELTO 15 MG TABS tablet, TAKE 1 TABLET BY MOUTH ONCE EVERY DAY, Disp: 90 tablet, Rfl: 2   Assessment   No orders of the defined types were placed in this encounter.    ICD-10-CM   1. Permanent atrial fibrillation (HCC)  I48.21     2. Essential hypertension  I10     3. Stage 3a chronic kidney disease (HCC)  N18.31      CHA2DS2-VASc Score is 4.  Yearly risk of stroke: 4.8% (A, HTN, DM).  Score of 1=0.6; 2=2.2; 3=3.2; 4=4.8; 5=7.2; 6=9.8; 7=>9.8) -(CHF; HTN; vasc disease DM,  Male = 1; Age <65 =0; 65-74 = 1,  >75 =2; stroke/embolism= 2).    Recommendations:   Dakota Scott  is a 88 y.o. peripheral arterial disease, hypertension, controlled diabetes mellitus with  stage 3 CKD, peripheral neuropathy, hyperlipidemia and permanent atrial fibrillation.     Presents for annual follow-up on hypertension, dyspnea and atrial fibrillation.  He has remained stable and states that his dyspnea is stable, no PND or orthopnea.  He has chronic leg edema and since recent travel abroad, he has noticed slight increasing leg edema.  I will discontinue furosemide and switch him to torsemide 20 mg daily.  Advised him to use support stockings and also potentially try Ace wrap if support stockings is hard to put them on.  With regard to atrial fibrillation, rate is well controlled, reviewed external labs, renal function has remained stable and his CBC is also remained stable.  I will see him back on an annual basis.   Yates Decamp, MD, [facility] 06/29/2022, 7:52 AM Office: [phone] Pager: [phone]

## 2022-06-30 ENCOUNTER — Ambulatory Visit: Payer: Medicare PPO | Admitting: Cardiology

## 2022-07-05 DIAGNOSIS — I480 Paroxysmal atrial fibrillation: Secondary | ICD-10-CM | POA: Diagnosis not present

## 2022-07-05 DIAGNOSIS — N1832 Chronic kidney disease, stage 3b: Secondary | ICD-10-CM | POA: Diagnosis not present

## 2022-07-05 DIAGNOSIS — M2550 Pain in unspecified joint: Secondary | ICD-10-CM | POA: Diagnosis not present

## 2022-07-05 DIAGNOSIS — M1A0710 Idiopathic chronic gout, right ankle and foot, without tophus (tophi): Secondary | ICD-10-CM | POA: Diagnosis not present

## 2022-07-05 DIAGNOSIS — E1140 Type 2 diabetes mellitus with diabetic neuropathy, unspecified: Secondary | ICD-10-CM | POA: Diagnosis not present

## 2022-07-05 DIAGNOSIS — R600 Localized edema: Secondary | ICD-10-CM | POA: Diagnosis not present

## 2022-07-05 DIAGNOSIS — E1122 Type 2 diabetes mellitus with diabetic chronic kidney disease: Secondary | ICD-10-CM | POA: Diagnosis not present

## 2022-07-05 DIAGNOSIS — R5381 Other malaise: Secondary | ICD-10-CM | POA: Diagnosis not present

## 2022-07-05 DIAGNOSIS — I1310 Hypertensive heart and chronic kidney disease without heart failure, with stage 1 through stage 4 chronic kidney disease, or unspecified chronic kidney disease: Secondary | ICD-10-CM | POA: Diagnosis not present

## 2022-07-07 DIAGNOSIS — R5381 Other malaise: Secondary | ICD-10-CM | POA: Diagnosis not present

## 2022-07-07 DIAGNOSIS — E1140 Type 2 diabetes mellitus with diabetic neuropathy, unspecified: Secondary | ICD-10-CM | POA: Diagnosis not present

## 2022-07-07 DIAGNOSIS — N1832 Chronic kidney disease, stage 3b: Secondary | ICD-10-CM | POA: Diagnosis not present

## 2022-07-07 DIAGNOSIS — M2550 Pain in unspecified joint: Secondary | ICD-10-CM | POA: Diagnosis not present

## 2022-07-07 DIAGNOSIS — M1A0710 Idiopathic chronic gout, right ankle and foot, without tophus (tophi): Secondary | ICD-10-CM | POA: Diagnosis not present

## 2022-07-07 DIAGNOSIS — R600 Localized edema: Secondary | ICD-10-CM | POA: Diagnosis not present

## 2022-07-07 DIAGNOSIS — I1310 Hypertensive heart and chronic kidney disease without heart failure, with stage 1 through stage 4 chronic kidney disease, or unspecified chronic kidney disease: Secondary | ICD-10-CM | POA: Diagnosis not present

## 2022-07-07 DIAGNOSIS — E1122 Type 2 diabetes mellitus with diabetic chronic kidney disease: Secondary | ICD-10-CM | POA: Diagnosis not present

## 2022-07-07 DIAGNOSIS — I480 Paroxysmal atrial fibrillation: Secondary | ICD-10-CM | POA: Diagnosis not present

## 2022-07-08 DIAGNOSIS — M2550 Pain in unspecified joint: Secondary | ICD-10-CM | POA: Diagnosis not present

## 2022-07-08 DIAGNOSIS — E1140 Type 2 diabetes mellitus with diabetic neuropathy, unspecified: Secondary | ICD-10-CM | POA: Diagnosis not present

## 2022-07-08 DIAGNOSIS — I1310 Hypertensive heart and chronic kidney disease without heart failure, with stage 1 through stage 4 chronic kidney disease, or unspecified chronic kidney disease: Secondary | ICD-10-CM | POA: Diagnosis not present

## 2022-07-08 DIAGNOSIS — N1832 Chronic kidney disease, stage 3b: Secondary | ICD-10-CM | POA: Diagnosis not present

## 2022-07-08 DIAGNOSIS — E1122 Type 2 diabetes mellitus with diabetic chronic kidney disease: Secondary | ICD-10-CM | POA: Diagnosis not present

## 2022-07-08 DIAGNOSIS — R600 Localized edema: Secondary | ICD-10-CM | POA: Diagnosis not present

## 2022-07-08 DIAGNOSIS — I480 Paroxysmal atrial fibrillation: Secondary | ICD-10-CM | POA: Diagnosis not present

## 2022-07-08 DIAGNOSIS — R5381 Other malaise: Secondary | ICD-10-CM | POA: Diagnosis not present

## 2022-07-08 DIAGNOSIS — M1A0710 Idiopathic chronic gout, right ankle and foot, without tophus (tophi): Secondary | ICD-10-CM | POA: Diagnosis not present

## 2022-07-12 DIAGNOSIS — E1140 Type 2 diabetes mellitus with diabetic neuropathy, unspecified: Secondary | ICD-10-CM | POA: Diagnosis not present

## 2022-07-12 DIAGNOSIS — R5381 Other malaise: Secondary | ICD-10-CM | POA: Diagnosis not present

## 2022-07-12 DIAGNOSIS — N1832 Chronic kidney disease, stage 3b: Secondary | ICD-10-CM | POA: Diagnosis not present

## 2022-07-12 DIAGNOSIS — I480 Paroxysmal atrial fibrillation: Secondary | ICD-10-CM | POA: Diagnosis not present

## 2022-07-12 DIAGNOSIS — M2550 Pain in unspecified joint: Secondary | ICD-10-CM | POA: Diagnosis not present

## 2022-07-12 DIAGNOSIS — I1310 Hypertensive heart and chronic kidney disease without heart failure, with stage 1 through stage 4 chronic kidney disease, or unspecified chronic kidney disease: Secondary | ICD-10-CM | POA: Diagnosis not present

## 2022-07-12 DIAGNOSIS — E1122 Type 2 diabetes mellitus with diabetic chronic kidney disease: Secondary | ICD-10-CM | POA: Diagnosis not present

## 2022-07-12 DIAGNOSIS — M1A0710 Idiopathic chronic gout, right ankle and foot, without tophus (tophi): Secondary | ICD-10-CM | POA: Diagnosis not present

## 2022-07-12 DIAGNOSIS — R600 Localized edema: Secondary | ICD-10-CM | POA: Diagnosis not present

## 2022-07-13 DIAGNOSIS — M1A0710 Idiopathic chronic gout, right ankle and foot, without tophus (tophi): Secondary | ICD-10-CM | POA: Diagnosis not present

## 2022-07-13 DIAGNOSIS — E1140 Type 2 diabetes mellitus with diabetic neuropathy, unspecified: Secondary | ICD-10-CM | POA: Diagnosis not present

## 2022-07-13 DIAGNOSIS — I480 Paroxysmal atrial fibrillation: Secondary | ICD-10-CM | POA: Diagnosis not present

## 2022-07-13 DIAGNOSIS — I1310 Hypertensive heart and chronic kidney disease without heart failure, with stage 1 through stage 4 chronic kidney disease, or unspecified chronic kidney disease: Secondary | ICD-10-CM | POA: Diagnosis not present

## 2022-07-13 DIAGNOSIS — R5381 Other malaise: Secondary | ICD-10-CM | POA: Diagnosis not present

## 2022-07-13 DIAGNOSIS — R600 Localized edema: Secondary | ICD-10-CM | POA: Diagnosis not present

## 2022-07-13 DIAGNOSIS — N1832 Chronic kidney disease, stage 3b: Secondary | ICD-10-CM | POA: Diagnosis not present

## 2022-07-13 DIAGNOSIS — M2550 Pain in unspecified joint: Secondary | ICD-10-CM | POA: Diagnosis not present

## 2022-07-13 DIAGNOSIS — E1122 Type 2 diabetes mellitus with diabetic chronic kidney disease: Secondary | ICD-10-CM | POA: Diagnosis not present

## 2022-07-14 ENCOUNTER — Other Ambulatory Visit: Payer: Self-pay | Admitting: Cardiology

## 2022-07-14 DIAGNOSIS — R600 Localized edema: Secondary | ICD-10-CM

## 2022-07-20 DIAGNOSIS — M1A0710 Idiopathic chronic gout, right ankle and foot, without tophus (tophi): Secondary | ICD-10-CM | POA: Diagnosis not present

## 2022-07-20 DIAGNOSIS — M1710 Unilateral primary osteoarthritis, unspecified knee: Secondary | ICD-10-CM | POA: Diagnosis not present

## 2022-07-20 DIAGNOSIS — N529 Male erectile dysfunction, unspecified: Secondary | ICD-10-CM | POA: Diagnosis not present

## 2022-07-20 DIAGNOSIS — E1122 Type 2 diabetes mellitus with diabetic chronic kidney disease: Secondary | ICD-10-CM | POA: Diagnosis not present

## 2022-07-20 DIAGNOSIS — I480 Paroxysmal atrial fibrillation: Secondary | ICD-10-CM | POA: Diagnosis not present

## 2022-07-20 DIAGNOSIS — N1832 Chronic kidney disease, stage 3b: Secondary | ICD-10-CM | POA: Diagnosis not present

## 2022-07-20 DIAGNOSIS — E782 Mixed hyperlipidemia: Secondary | ICD-10-CM | POA: Diagnosis not present

## 2022-07-20 DIAGNOSIS — I1310 Hypertensive heart and chronic kidney disease without heart failure, with stage 1 through stage 4 chronic kidney disease, or unspecified chronic kidney disease: Secondary | ICD-10-CM | POA: Diagnosis not present

## 2022-07-20 DIAGNOSIS — E1140 Type 2 diabetes mellitus with diabetic neuropathy, unspecified: Secondary | ICD-10-CM | POA: Diagnosis not present

## 2022-07-22 DIAGNOSIS — M1710 Unilateral primary osteoarthritis, unspecified knee: Secondary | ICD-10-CM | POA: Diagnosis not present

## 2022-07-22 DIAGNOSIS — I480 Paroxysmal atrial fibrillation: Secondary | ICD-10-CM | POA: Diagnosis not present

## 2022-07-22 DIAGNOSIS — E1122 Type 2 diabetes mellitus with diabetic chronic kidney disease: Secondary | ICD-10-CM | POA: Diagnosis not present

## 2022-07-22 DIAGNOSIS — E1140 Type 2 diabetes mellitus with diabetic neuropathy, unspecified: Secondary | ICD-10-CM | POA: Diagnosis not present

## 2022-07-22 DIAGNOSIS — M1A0710 Idiopathic chronic gout, right ankle and foot, without tophus (tophi): Secondary | ICD-10-CM | POA: Diagnosis not present

## 2022-07-22 DIAGNOSIS — E782 Mixed hyperlipidemia: Secondary | ICD-10-CM | POA: Diagnosis not present

## 2022-07-22 DIAGNOSIS — N1832 Chronic kidney disease, stage 3b: Secondary | ICD-10-CM | POA: Diagnosis not present

## 2022-07-22 DIAGNOSIS — I1310 Hypertensive heart and chronic kidney disease without heart failure, with stage 1 through stage 4 chronic kidney disease, or unspecified chronic kidney disease: Secondary | ICD-10-CM | POA: Diagnosis not present

## 2022-07-22 DIAGNOSIS — N529 Male erectile dysfunction, unspecified: Secondary | ICD-10-CM | POA: Diagnosis not present

## 2022-07-28 DIAGNOSIS — E1140 Type 2 diabetes mellitus with diabetic neuropathy, unspecified: Secondary | ICD-10-CM | POA: Diagnosis not present

## 2022-07-28 DIAGNOSIS — M1A0710 Idiopathic chronic gout, right ankle and foot, without tophus (tophi): Secondary | ICD-10-CM | POA: Diagnosis not present

## 2022-07-28 DIAGNOSIS — I1310 Hypertensive heart and chronic kidney disease without heart failure, with stage 1 through stage 4 chronic kidney disease, or unspecified chronic kidney disease: Secondary | ICD-10-CM | POA: Diagnosis not present

## 2022-07-28 DIAGNOSIS — E782 Mixed hyperlipidemia: Secondary | ICD-10-CM | POA: Diagnosis not present

## 2022-07-28 DIAGNOSIS — E1122 Type 2 diabetes mellitus with diabetic chronic kidney disease: Secondary | ICD-10-CM | POA: Diagnosis not present

## 2022-07-28 DIAGNOSIS — M1710 Unilateral primary osteoarthritis, unspecified knee: Secondary | ICD-10-CM | POA: Diagnosis not present

## 2022-07-28 DIAGNOSIS — I480 Paroxysmal atrial fibrillation: Secondary | ICD-10-CM | POA: Diagnosis not present

## 2022-07-28 DIAGNOSIS — N529 Male erectile dysfunction, unspecified: Secondary | ICD-10-CM | POA: Diagnosis not present

## 2022-07-28 DIAGNOSIS — N1832 Chronic kidney disease, stage 3b: Secondary | ICD-10-CM | POA: Diagnosis not present

## 2022-07-29 DIAGNOSIS — N529 Male erectile dysfunction, unspecified: Secondary | ICD-10-CM | POA: Diagnosis not present

## 2022-07-29 DIAGNOSIS — M1710 Unilateral primary osteoarthritis, unspecified knee: Secondary | ICD-10-CM | POA: Diagnosis not present

## 2022-07-29 DIAGNOSIS — N1832 Chronic kidney disease, stage 3b: Secondary | ICD-10-CM | POA: Diagnosis not present

## 2022-07-29 DIAGNOSIS — E1140 Type 2 diabetes mellitus with diabetic neuropathy, unspecified: Secondary | ICD-10-CM | POA: Diagnosis not present

## 2022-07-29 DIAGNOSIS — E782 Mixed hyperlipidemia: Secondary | ICD-10-CM | POA: Diagnosis not present

## 2022-07-29 DIAGNOSIS — I480 Paroxysmal atrial fibrillation: Secondary | ICD-10-CM | POA: Diagnosis not present

## 2022-07-29 DIAGNOSIS — E1122 Type 2 diabetes mellitus with diabetic chronic kidney disease: Secondary | ICD-10-CM | POA: Diagnosis not present

## 2022-07-29 DIAGNOSIS — I1310 Hypertensive heart and chronic kidney disease without heart failure, with stage 1 through stage 4 chronic kidney disease, or unspecified chronic kidney disease: Secondary | ICD-10-CM | POA: Diagnosis not present

## 2022-07-29 DIAGNOSIS — M1A0710 Idiopathic chronic gout, right ankle and foot, without tophus (tophi): Secondary | ICD-10-CM | POA: Diagnosis not present

## 2022-08-11 ENCOUNTER — Other Ambulatory Visit: Payer: Self-pay | Admitting: Cardiology

## 2022-08-11 DIAGNOSIS — R600 Localized edema: Secondary | ICD-10-CM

## 2022-08-12 DIAGNOSIS — E782 Mixed hyperlipidemia: Secondary | ICD-10-CM | POA: Diagnosis not present

## 2022-08-12 DIAGNOSIS — N529 Male erectile dysfunction, unspecified: Secondary | ICD-10-CM | POA: Diagnosis not present

## 2022-08-12 DIAGNOSIS — I1310 Hypertensive heart and chronic kidney disease without heart failure, with stage 1 through stage 4 chronic kidney disease, or unspecified chronic kidney disease: Secondary | ICD-10-CM | POA: Diagnosis not present

## 2022-08-12 DIAGNOSIS — M1710 Unilateral primary osteoarthritis, unspecified knee: Secondary | ICD-10-CM | POA: Diagnosis not present

## 2022-08-12 DIAGNOSIS — E1122 Type 2 diabetes mellitus with diabetic chronic kidney disease: Secondary | ICD-10-CM | POA: Diagnosis not present

## 2022-08-12 DIAGNOSIS — E1140 Type 2 diabetes mellitus with diabetic neuropathy, unspecified: Secondary | ICD-10-CM | POA: Diagnosis not present

## 2022-08-12 DIAGNOSIS — M1A0710 Idiopathic chronic gout, right ankle and foot, without tophus (tophi): Secondary | ICD-10-CM | POA: Diagnosis not present

## 2022-08-12 DIAGNOSIS — I480 Paroxysmal atrial fibrillation: Secondary | ICD-10-CM | POA: Diagnosis not present

## 2022-08-12 DIAGNOSIS — N1832 Chronic kidney disease, stage 3b: Secondary | ICD-10-CM | POA: Diagnosis not present

## 2022-08-16 DIAGNOSIS — M1A0710 Idiopathic chronic gout, right ankle and foot, without tophus (tophi): Secondary | ICD-10-CM | POA: Diagnosis not present

## 2022-08-16 DIAGNOSIS — N529 Male erectile dysfunction, unspecified: Secondary | ICD-10-CM | POA: Diagnosis not present

## 2022-08-16 DIAGNOSIS — I480 Paroxysmal atrial fibrillation: Secondary | ICD-10-CM | POA: Diagnosis not present

## 2022-08-16 DIAGNOSIS — I1310 Hypertensive heart and chronic kidney disease without heart failure, with stage 1 through stage 4 chronic kidney disease, or unspecified chronic kidney disease: Secondary | ICD-10-CM | POA: Diagnosis not present

## 2022-08-16 DIAGNOSIS — E1140 Type 2 diabetes mellitus with diabetic neuropathy, unspecified: Secondary | ICD-10-CM | POA: Diagnosis not present

## 2022-08-16 DIAGNOSIS — E1122 Type 2 diabetes mellitus with diabetic chronic kidney disease: Secondary | ICD-10-CM | POA: Diagnosis not present

## 2022-08-16 DIAGNOSIS — E782 Mixed hyperlipidemia: Secondary | ICD-10-CM | POA: Diagnosis not present

## 2022-08-16 DIAGNOSIS — M1710 Unilateral primary osteoarthritis, unspecified knee: Secondary | ICD-10-CM | POA: Diagnosis not present

## 2022-08-16 DIAGNOSIS — N1832 Chronic kidney disease, stage 3b: Secondary | ICD-10-CM | POA: Diagnosis not present

## 2022-08-19 DIAGNOSIS — E1140 Type 2 diabetes mellitus with diabetic neuropathy, unspecified: Secondary | ICD-10-CM | POA: Diagnosis not present

## 2022-08-19 DIAGNOSIS — M1A0710 Idiopathic chronic gout, right ankle and foot, without tophus (tophi): Secondary | ICD-10-CM | POA: Diagnosis not present

## 2022-08-19 DIAGNOSIS — M1710 Unilateral primary osteoarthritis, unspecified knee: Secondary | ICD-10-CM | POA: Diagnosis not present

## 2022-08-19 DIAGNOSIS — N529 Male erectile dysfunction, unspecified: Secondary | ICD-10-CM | POA: Diagnosis not present

## 2022-08-19 DIAGNOSIS — E782 Mixed hyperlipidemia: Secondary | ICD-10-CM | POA: Diagnosis not present

## 2022-08-19 DIAGNOSIS — E1122 Type 2 diabetes mellitus with diabetic chronic kidney disease: Secondary | ICD-10-CM | POA: Diagnosis not present

## 2022-08-19 DIAGNOSIS — I1310 Hypertensive heart and chronic kidney disease without heart failure, with stage 1 through stage 4 chronic kidney disease, or unspecified chronic kidney disease: Secondary | ICD-10-CM | POA: Diagnosis not present

## 2022-08-19 DIAGNOSIS — N1832 Chronic kidney disease, stage 3b: Secondary | ICD-10-CM | POA: Diagnosis not present

## 2022-08-19 DIAGNOSIS — I480 Paroxysmal atrial fibrillation: Secondary | ICD-10-CM | POA: Diagnosis not present

## 2022-08-24 DIAGNOSIS — N1832 Chronic kidney disease, stage 3b: Secondary | ICD-10-CM | POA: Diagnosis not present

## 2022-08-24 DIAGNOSIS — M1710 Unilateral primary osteoarthritis, unspecified knee: Secondary | ICD-10-CM | POA: Diagnosis not present

## 2022-08-24 DIAGNOSIS — N529 Male erectile dysfunction, unspecified: Secondary | ICD-10-CM | POA: Diagnosis not present

## 2022-08-24 DIAGNOSIS — E1140 Type 2 diabetes mellitus with diabetic neuropathy, unspecified: Secondary | ICD-10-CM | POA: Diagnosis not present

## 2022-08-24 DIAGNOSIS — M1A0710 Idiopathic chronic gout, right ankle and foot, without tophus (tophi): Secondary | ICD-10-CM | POA: Diagnosis not present

## 2022-08-24 DIAGNOSIS — I480 Paroxysmal atrial fibrillation: Secondary | ICD-10-CM | POA: Diagnosis not present

## 2022-08-24 DIAGNOSIS — E782 Mixed hyperlipidemia: Secondary | ICD-10-CM | POA: Diagnosis not present

## 2022-08-24 DIAGNOSIS — E1122 Type 2 diabetes mellitus with diabetic chronic kidney disease: Secondary | ICD-10-CM | POA: Diagnosis not present

## 2022-08-24 DIAGNOSIS — I1310 Hypertensive heart and chronic kidney disease without heart failure, with stage 1 through stage 4 chronic kidney disease, or unspecified chronic kidney disease: Secondary | ICD-10-CM | POA: Diagnosis not present

## 2022-09-15 ENCOUNTER — Other Ambulatory Visit: Payer: Self-pay | Admitting: Cardiology

## 2022-09-15 DIAGNOSIS — R600 Localized edema: Secondary | ICD-10-CM

## 2022-09-15 DIAGNOSIS — I480 Paroxysmal atrial fibrillation: Secondary | ICD-10-CM | POA: Diagnosis not present

## 2022-09-15 DIAGNOSIS — R052 Subacute cough: Secondary | ICD-10-CM | POA: Diagnosis not present

## 2022-09-15 DIAGNOSIS — I1310 Hypertensive heart and chronic kidney disease without heart failure, with stage 1 through stage 4 chronic kidney disease, or unspecified chronic kidney disease: Secondary | ICD-10-CM | POA: Diagnosis not present

## 2022-09-15 DIAGNOSIS — E1122 Type 2 diabetes mellitus with diabetic chronic kidney disease: Secondary | ICD-10-CM | POA: Diagnosis not present

## 2022-09-15 DIAGNOSIS — N1832 Chronic kidney disease, stage 3b: Secondary | ICD-10-CM | POA: Diagnosis not present

## 2022-09-15 DIAGNOSIS — E1140 Type 2 diabetes mellitus with diabetic neuropathy, unspecified: Secondary | ICD-10-CM | POA: Diagnosis not present

## 2022-10-12 ENCOUNTER — Other Ambulatory Visit: Payer: Self-pay | Admitting: Cardiology

## 2022-10-12 DIAGNOSIS — R600 Localized edema: Secondary | ICD-10-CM

## 2022-10-21 DIAGNOSIS — H04123 Dry eye syndrome of bilateral lacrimal glands: Secondary | ICD-10-CM | POA: Diagnosis not present

## 2022-10-21 DIAGNOSIS — H401132 Primary open-angle glaucoma, bilateral, moderate stage: Secondary | ICD-10-CM | POA: Diagnosis not present

## 2022-10-21 DIAGNOSIS — H04213 Epiphora due to excess lacrimation, bilateral lacrimal glands: Secondary | ICD-10-CM | POA: Diagnosis not present

## 2022-11-08 ENCOUNTER — Other Ambulatory Visit: Payer: Self-pay | Admitting: Cardiology

## 2022-11-08 DIAGNOSIS — R600 Localized edema: Secondary | ICD-10-CM

## 2022-11-16 DIAGNOSIS — I480 Paroxysmal atrial fibrillation: Secondary | ICD-10-CM | POA: Diagnosis not present

## 2022-11-16 DIAGNOSIS — E1122 Type 2 diabetes mellitus with diabetic chronic kidney disease: Secondary | ICD-10-CM | POA: Diagnosis not present

## 2022-11-16 DIAGNOSIS — N1832 Chronic kidney disease, stage 3b: Secondary | ICD-10-CM | POA: Diagnosis not present

## 2022-11-16 DIAGNOSIS — E1140 Type 2 diabetes mellitus with diabetic neuropathy, unspecified: Secondary | ICD-10-CM | POA: Diagnosis not present

## 2022-11-16 DIAGNOSIS — I1310 Hypertensive heart and chronic kidney disease without heart failure, with stage 1 through stage 4 chronic kidney disease, or unspecified chronic kidney disease: Secondary | ICD-10-CM | POA: Diagnosis not present

## 2022-11-21 DIAGNOSIS — H04123 Dry eye syndrome of bilateral lacrimal glands: Secondary | ICD-10-CM | POA: Diagnosis not present

## 2022-11-21 DIAGNOSIS — H401131 Primary open-angle glaucoma, bilateral, mild stage: Secondary | ICD-10-CM | POA: Diagnosis not present

## 2022-12-12 ENCOUNTER — Other Ambulatory Visit: Payer: Self-pay | Admitting: Cardiology

## 2022-12-12 DIAGNOSIS — R600 Localized edema: Secondary | ICD-10-CM

## 2023-01-12 ENCOUNTER — Other Ambulatory Visit: Payer: Self-pay | Admitting: Cardiology

## 2023-01-12 DIAGNOSIS — R600 Localized edema: Secondary | ICD-10-CM

## 2023-06-05 DIAGNOSIS — Z0001 Encounter for general adult medical examination with abnormal findings: Secondary | ICD-10-CM | POA: Diagnosis not present

## 2023-06-05 DIAGNOSIS — M1A0710 Idiopathic chronic gout, right ankle and foot, without tophus (tophi): Secondary | ICD-10-CM | POA: Diagnosis not present

## 2023-06-05 DIAGNOSIS — N1832 Chronic kidney disease, stage 3b: Secondary | ICD-10-CM | POA: Diagnosis not present

## 2023-06-05 DIAGNOSIS — R062 Wheezing: Secondary | ICD-10-CM | POA: Diagnosis not present

## 2023-06-05 DIAGNOSIS — E1122 Type 2 diabetes mellitus with diabetic chronic kidney disease: Secondary | ICD-10-CM | POA: Diagnosis not present

## 2023-06-05 DIAGNOSIS — E1140 Type 2 diabetes mellitus with diabetic neuropathy, unspecified: Secondary | ICD-10-CM | POA: Diagnosis not present

## 2023-06-05 DIAGNOSIS — I480 Paroxysmal atrial fibrillation: Secondary | ICD-10-CM | POA: Diagnosis not present

## 2023-06-05 DIAGNOSIS — I1310 Hypertensive heart and chronic kidney disease without heart failure, with stage 1 through stage 4 chronic kidney disease, or unspecified chronic kidney disease: Secondary | ICD-10-CM | POA: Diagnosis not present

## 2023-06-20 ENCOUNTER — Other Ambulatory Visit: Payer: Self-pay

## 2023-06-20 DIAGNOSIS — M1A0710 Idiopathic chronic gout, right ankle and foot, without tophus (tophi): Secondary | ICD-10-CM | POA: Diagnosis not present

## 2023-06-20 DIAGNOSIS — N1832 Chronic kidney disease, stage 3b: Secondary | ICD-10-CM | POA: Diagnosis not present

## 2023-06-20 DIAGNOSIS — E1122 Type 2 diabetes mellitus with diabetic chronic kidney disease: Secondary | ICD-10-CM | POA: Diagnosis not present

## 2023-06-20 DIAGNOSIS — I1310 Hypertensive heart and chronic kidney disease without heart failure, with stage 1 through stage 4 chronic kidney disease, or unspecified chronic kidney disease: Secondary | ICD-10-CM | POA: Diagnosis not present

## 2023-06-20 DIAGNOSIS — I480 Paroxysmal atrial fibrillation: Secondary | ICD-10-CM | POA: Diagnosis not present

## 2023-06-20 DIAGNOSIS — E1140 Type 2 diabetes mellitus with diabetic neuropathy, unspecified: Secondary | ICD-10-CM | POA: Diagnosis not present

## 2023-06-20 DIAGNOSIS — Z0001 Encounter for general adult medical examination with abnormal findings: Secondary | ICD-10-CM | POA: Diagnosis not present

## 2023-06-20 NOTE — Progress Notes (Signed)
 06/21/2023 Name: Dakota Scott MRN: [id number] DOB: 01/09/1934  Chief Complaint  Patient presents with   Medication Management    Dakota Scott is a 89 y.o. year old male who was referred for medication management by their primary care provider, Tretha Fu, MD. They presented for a face to face visit today.   They were referred to the pharmacist by their PCP for assistance in managing diabetes and complex medication management    Subjective: Patient comes in with medications, no immediate medication concerns. Patient has multiple daughters who are pharmacist, help him with medications. He also has a daughter who is a Publishing rights manager and with him today for his visit.   Care Team: Primary Care Provider: Tretha Fu, MD ; Next Scheduled Visit: 09/19/23   Medication Access/Adherence  Current Pharmacy:  [facility] - [place], [place] - [address] [place] [place] [address] Phone: [phone] Fax: [phone]  [id number] - [place], [place] - [address] AT [facility] [address] [place] [address] Phone: [phone] Fax: [phone]   Patient reports affordability concerns with their medications: No  Patient reports access/transportation concerns to their pharmacy: No  Patient reports adherence concerns with their medications:  No.   Medication Management:  Current adherence strategy: Patient receiving compliance packaging from his dispensing pharmacy. Promotes adherence as daughter reports patient sometimes has visual troubles reading rx labels.   Patient reports Good adherence to medications  Patient reports the following barriers to adherence: no barriers reported  Recent fill dates:     Objective:  Medications Reviewed Today     Reviewed by Livia Riffle, [facility] (Pharmacist) on 06/20/23 at 1705  Med List Status: <None>   Medication Order  Taking? Sig Documenting Provider Last Dose Status Informant  acetaminophen  (TYLENOL ) 325 MG tablet [id number] Yes Take 650 mg by mouth every 6 (six) hours as needed. [provider] Taking Active   albuterol (VENTOLIN HFA) 108 (90 Base) MCG/ACT inhaler [id number] Yes 2 puff(s) inhaled every 6 hours prn for 30 days [provider] Taking Active   amLODipine -valsartan (EXFORGE) 5-320 MG tablet [id number] Yes 1 tab(s) orally once a day for 90 days [provider] Taking Active   carvedilol (COREG) 3.125 MG tablet [id number] Yes 1 tab(s) orally 2 times a day for 90 days [provider] Taking Active   colchicine  0.6 MG tablet [id number] Yes At onset of gout flare take 2 tablets, may take 1 tablet 1 hr later if not relieved. May do this daily up to three days.  Patient taking differently: Take 0.6 mg by mouth daily. At onset of gout flare take 2 tablets, may take 1 tablet 1 hr later if not relieved. May do this daily up to three days.   Norins, Michael E, MD Taking Active Child  Continuous Glucose Sensor (FREESTYLE LIBRE 3 SENSOR) [place] [id number] Yes SMARTSIG:1 Every 2 Weeks [provider] Taking Active   doxazosin  (CARDURA ) 4 MG tablet [id number] Yes Take 4 mg by mouth daily. [provider] Taking Active   Febuxostat 80 MG TABS [id number] Yes 1 tab(s) orally once a day for 90 days [provider] Taking Active   finasteride (PROSCAR) 5 MG tablet [id number] Yes 1 tab(s) orally once a day for 90 days [provider] Taking Active   glimepiride (AMARYL) 4 MG tablet [id number] Yes 1 tab(s) orally once a day for 90 days [provider] Taking Active   JARDIANCE 10 MG TABS tablet [id number] Yes 1 tab(s) orally once a day (in the morning) for 90 days [provider] Taking Active   latanoprost (XALATAN) 0.005 % ophthalmic solution [id number] Yes 1 gtt in each eye once a day (in the evening) [provider] Taking Active    Rivaroxaban  (XARELTO ) 15 MG TABS tablet [id number] Yes 1 tab(s) orally once a day (in the evening) for 90 days [provider] Taking Active   rosuvastatin (CRESTOR) 20 MG tablet [id number] Yes 1 tab(s) orally once a day for 90 days [provider] Taking Active   torsemide  (DEMADEX ) 20 MG tablet [id number] Yes 1 tab(s) orally once a day [provider] Taking Active               Assessment/Plan:   Medication Management: - Currently strategy sufficient to maintain appropriate adherence to prescribed medication regimen - No immediate medication issues to resolve - Called pharmacy to follow-up on Jardiance claims, pt has a zero copay.     Follow Up Plan: Will follow up with patient in 3 months.  Livia Riffle, PharmD Clinical Pharmacist  [phone]

## 2023-07-06 ENCOUNTER — Other Ambulatory Visit: Payer: Self-pay | Admitting: Cardiology

## 2023-07-06 DIAGNOSIS — R600 Localized edema: Secondary | ICD-10-CM

## 2023-08-04 ENCOUNTER — Other Ambulatory Visit: Payer: Self-pay | Admitting: Cardiology

## 2023-08-04 DIAGNOSIS — R600 Localized edema: Secondary | ICD-10-CM

## 2023-08-04 MED ORDER — TORSEMIDE 20 MG PO TABS: ORAL_TABLET | ORAL | 0 refills | Status: DC

## 2023-08-07 ENCOUNTER — Telehealth: Payer: Self-pay | Admitting: Cardiology

## 2023-08-07 DIAGNOSIS — I4821 Permanent atrial fibrillation: Secondary | ICD-10-CM

## 2023-08-07 DIAGNOSIS — R600 Localized edema: Secondary | ICD-10-CM

## 2023-08-07 NOTE — Telephone Encounter (Signed)
*  STAT* If patient is at the pharmacy, call can be transferred to refill team.   1. Which medications need to be refilled? (please list name of each medication and dose if known) All cardiac meds   2. Would you like to learn more about the convenience, safety, & potential cost savings by using the [facility]? N/A     3. Are you open to using the [facility] (Type [facility]. N/A   4. Which pharmacy/location (including street and city if local pharmacy) is medication to be sent to?  [facility] - [place], [place] - [address] [place] [address]    5. Do they need a 30 day or 90 day supply? Needs enough medication to last until 8/14 appt.  Patient will be out of meds on 06/23

## 2023-08-08 MED ORDER — RIVAROXABAN 15 MG PO TABS: 15.0000 mg | ORAL_TABLET | Freq: Every day | ORAL | 0 refills | Status: AC

## 2023-08-08 MED ORDER — TORSEMIDE 20 MG PO TABS: ORAL_TABLET | ORAL | 0 refills | Status: DC

## 2023-08-08 MED ORDER — DOXAZOSIN MESYLATE 4 MG PO TABS: 4.0000 mg | ORAL_TABLET | Freq: Every day | ORAL | 0 refills | Status: AC

## 2023-08-08 NOTE — Telephone Encounter (Signed)
 Prescription refill request for Xarelto  received.  Indication: Afib  Last office visit: 06/29/22 Berry Bristol)  Weight: 83.6kg Age: 89 Scr: OVERDUE CrCl:   Labs and office visit overdue. Pt has scheduled appt with Dr Berry Bristol on 8/02/25/23. Note placed on appt for labs to be drawn at this appt. Refill sent to prevent any missed doses.

## 2023-09-08 ENCOUNTER — Telehealth: Payer: Self-pay | Admitting: Cardiology

## 2023-09-08 MED ORDER — CARVEDILOL 3.125 MG PO TABS: 3.1250 mg | ORAL_TABLET | Freq: Two times a day (BID) | ORAL | 0 refills | Status: AC

## 2023-09-08 NOTE — Telephone Encounter (Signed)
*  STAT* If patient is at the pharmacy, call can be transferred to refill team.   1. Which medications need to be refilled? (please list name of each medication and dose if known) carvedilol (COREG) 3.125 MG tablet    2. Would you like to learn more about the convenience, safety, & potential cost savings by using the [facility]? No   3. Are you open to using the [facility] ([facility]. ) No   4. Which pharmacy/location (including street and city if local pharmacy) is medication to be sent to?   [facility] - [place], [place] - [address] Phone: [phone]  Fax: [phone]       5. Do they need a 30 day or 90 day supply? 90 day  Pt has scheduled appt on 8/14

## 2023-09-08 NOTE — Telephone Encounter (Signed)
 Pt's medication was sent to pt's pharmacy as requested. Confirmation received.

## 2023-10-05 ENCOUNTER — Ambulatory Visit: Admitting: Cardiology

## 2023-11-06 ENCOUNTER — Other Ambulatory Visit: Payer: Self-pay | Admitting: Cardiology

## 2023-11-06 DIAGNOSIS — R600 Localized edema: Secondary | ICD-10-CM

## 2023-12-01 ENCOUNTER — Encounter: Payer: Self-pay | Admitting: Cardiology

## 2023-12-01 ENCOUNTER — Ambulatory Visit: Attending: Cardiology | Admitting: Cardiology

## 2023-12-01 VITALS — BP 142/79 | HR 73 | Ht 66.0 in | Wt 182.5 lb

## 2023-12-01 DIAGNOSIS — I119 Hypertensive heart disease without heart failure: Secondary | ICD-10-CM

## 2023-12-01 DIAGNOSIS — D509 Iron deficiency anemia, unspecified: Secondary | ICD-10-CM

## 2023-12-01 DIAGNOSIS — E1122 Type 2 diabetes mellitus with diabetic chronic kidney disease: Secondary | ICD-10-CM

## 2023-12-01 DIAGNOSIS — I10 Essential (primary) hypertension: Secondary | ICD-10-CM | POA: Diagnosis not present

## 2023-12-01 DIAGNOSIS — N1832 Chronic kidney disease, stage 3b: Secondary | ICD-10-CM

## 2023-12-01 DIAGNOSIS — I4821 Permanent atrial fibrillation: Secondary | ICD-10-CM

## 2023-12-01 NOTE — Patient Instructions (Signed)
 Medication Instructions:  The current medical regimen is effective;  continue present plan and medications.  *If you need a refill on your cardiac medications before your next appointment, please call your pharmacy*  Follow-Up: At [facility], you and your health needs are our priority.  As part of our continuing mission to provide you with exceptional heart care, our providers are all part of one team.  This team includes your primary Cardiologist (physician) and Advanced Practice Providers or APPs (Physician Assistants and Nurse Practitioners) who all work together to provide you with the care you need, when you need it.  Follow up as needed.   We recommend signing up for the patient portal called "MyChart".  Sign up information is provided on this After Visit Summary.  MyChart is used to connect with patients for Virtual Visits (Telemedicine).  Patients are able to view lab/test results, encounter notes, upcoming appointments, etc.  Non-urgent messages can be sent to your provider as well.   To learn more about what you can do with MyChart, go to [email or web address].

## 2023-12-01 NOTE — Progress Notes (Signed)
 Cardiology Office Note:  .   Date:  12/02/2023  ID:  Dakota Scott, DOB 11-28-1933, MRN [id number] PCP: Dakota Bare, MD  [facility] Providers Cardiologist:  None   History of Present Illness: Dakota   Dakota Scott is a 89 y.o. peripheral arterial disease, hypertension, controlled diabetes mellitus with stage 3b CKD, peripheral neuropathy, hyperlipidemia and permanent atrial fibrillation.  He is accompanied by his daughter who is a Publishing rights manager, presents for routine visit.  Except for general deterioration due to advanced age, developing peripheral neuropathy, severe arthritis, also now developing loss of hearing and vision loss from diabetes mellitus, daughter sees that her father is slowly going down.  He has no specific complaints except states that he gets easily tired and his legs give out due to severe pain in his joints.  He also notices leg edema.  His activity is markedly limited.  Cardiac Studies relevent.    ECHOCARDIOGRAM COMPLETE 09/14/2021  1. Left ventricular ejection fraction, by estimation, is 60 to 65%. The left ventricle has normal function. The left ventricle has no regional wall motion abnormalities. There is severe left ventricular hypertrophy. Left ventricular diastolic parameters are indeterminate. 2.  Biatrial size was moderately dilated.  Lexiscan  Sestamibi stress test 04/16/2018:  Normal perfusion, no evidence of ischemia, EF 57%.    Discussed the use of AI scribe software for clinical note transcription with the patient, who gave verbal consent to proceed.  History of Present Illness Dakota Scott is an 89 year old male with chronic atrial fibrillation and chronic kidney disease who presents for a cardiovascular follow-up. He is accompanied by his daughter, who is also his primary caregiver.  He experiences fatigue and describes his heart as 'tired'. He has difficulty moving around the house and sometimes feels short of breath. He  notes swelling in his feet and pain in his legs, which he attributes to his heart feeling tired when he walks. He reports leg edema and has peripheral arterial disease with absent pedal pulses.  He has significant sleep disturbances, sleeping only one to two hours at a time before waking up due to back pain. He has not been active outside the home for the past four months, preferring to stay at home rather than accompany his family to the store. He uses a cane at home and has a small wheelchair for outings, though he has not used it recently.  He is on Xarelto  15 mg once daily in the evening for chronic atrial fibrillation. He takes amlodipine  and valsartan combination 5/320 mg once daily for blood pressure management. His hemoglobin has dropped from 11.4 to 9.8, and he has microcytic indices.  Labs   Care everywhere/Faxed External Labs:  Labs 06/06/2023:  A1c 7.7%.  Urinary albumin to creatinine ratio 82.  Hb 9.8/HCT 32.5, platelets 226.  Microcytic indicis.  Serum glucose 254, BUN 29, creatinine 1.58, eGFR 42 mL, potassium 4.2, LFTs normal.  Total cholesterol 184, triglycerides 197, HDL 25, LDL 32.  ROS  Review of Systems  Constitutional: Positive for malaise/fatigue.  Cardiovascular:  Positive for leg swelling. Negative for chest pain and dyspnea on exertion.   Physical Exam:   VS:  BP (!) 142/79   Pulse 73   Ht 5' 6 (1.676 m)   Wt 182 lb 8 oz (82.8 kg)   SpO2 99%   BMI 29.46 kg/m    Wt Readings from Last 3 Encounters:  12/01/23 182 lb 8 oz (82.8 kg)  08/11/21 184 lb  3.2 oz (83.6 kg)  06/28/21 193 lb 6.4 oz (87.7 kg)    BP Readings from Last 3 Encounters:  12/01/23 (!) 142/79  08/11/21 112/70  06/28/21 140/80   Physical Exam Neck:     Vascular: No carotid bruit or JVD.  Cardiovascular:     Rate and Rhythm: Normal rate. Rhythm irregular.     Pulses: Normal pulses and intact distal pulses.     Heart sounds: No murmur heard. Pulmonary:     Effort: Pulmonary effort  is normal.     Breath sounds: Normal breath sounds.  Abdominal:     General: Bowel sounds are normal.     Palpations: Abdomen is soft.  Musculoskeletal:     Right lower leg: No edema.     Left lower leg: No edema.  Skin:    Capillary Refill: Capillary refill takes less than 2 seconds.    EKG:    EKG Interpretation Date/Time:  Friday December 01 2023 09:39:57 EDT Ventricular Rate:  73 PR Interval:    QRS Duration:  74 QT Interval:  402 QTC Calculation: 442 R Axis:   -45  Text Interpretation: EKG 12/01/2023: Atrial fibrillation with controlled ventricular response at the rate of 73 bpm, left anterior fascicular block, incomplete right bundle branch block.  Anteroseptal infarct old.  Single PVC.  Nonspecific T abnormality.  Compared to 08/24/2019, no significant change. Confirmed by Dakota Scott, Dakota Scott ([id number]) on 12/01/2023 9:47:22 AM    ASSESSMENT AND PLAN: .      ICD-10-CM   1. Permanent atrial fibrillation (HCC)  I48.21 EKG 12-Lead    2. Essential hypertension  I10     3. LVH (left ventricular hypertrophy) due to hypertensive disease, without heart failure  I11.9     4. Microcytic anemia  D50.9     5. Type 2 diabetes mellitus with stage 3b chronic kidney disease, without long-term current use of insulin (HCC)  E11.22    N18.32      Assessment & Plan Permanent atrial fibrillation Chronic atrial fibrillation, well-controlled heart rate, no heart failure symptoms, on Xarelto  15 mg once daily. - Continue Xarelto  15 mg once daily. - Monitor for signs of bleeding due to anticoagulation.  Chronic diastolic heart failure (mild) Mild chronic diastolic heart failure, no signs of volume overload, lungs clear, no ascites, neck veins flat, leg edema attributed to dependency. - Continue current heart failure management. - Encourage use of walker instead of cane to aid mobility.  Peripheral arterial disease of lower extremities Peripheral arterial disease with absent pedal pulses, no  critical limb ischemia or open wounds, age-related gradual deterioration. - Monitor for any signs of critical limb ischemia.  Chronic kidney disease stage 3A-B Stage 3A-B chronic kidney disease with eGFR between 42-45, no acute changes. - Continue Jardiance 10 mg once daily.  Iron deficiency anemia Hemoglobin dropped from 11.4 to 9.8 with microcytic indices, possible slow bleeding or anemia of chronic disease, further evaluation needed to rule out bleeding. - Discuss with PCP the need for stool guaiac test to rule out gastrointestinal bleeding.  Dependent lower extremity edema Dependent edema in lower extremities, not due to heart failure, improves with elevation, difficulty using compression stockings due to age. - Recommend wearing simple supportive socks in the morning before getting up to help with swelling.  Impaired mobility and functional decline Impaired mobility and functional decline over the past four months, uses a cane and occasionally a small walker, limited activity due to fatigue and leg pain. - Encourage use  of small walker for better support. - Encourage walking at home as tolerated. - End-of-life issues discussed with the patient and the daughter. - In view of the culture, daughter wishes that no directives be placed in the chart, at this point patient wishes to be full code.  I have extensively discussed regarding poor outcomes, especially if there is prolonged intubation or CPR.  Encouraged him to have a open family discussion regarding end-of-life issues.  Respected their use and their opinion especially in view of their culture.   Follow up: As he has remained stable from cardiac standpoint, I will see him back on a as needed basis.  Would highly recommend follow-up on anemia as he will see the trends going down.  Reviewed the BMP and CMP.  Diabetes is relatively well-controlled with diabetic nephropathy.  Signed,  Gordy Bergamo, MD, [employer] [facility] 12/02/2023, 11:40 AM [facility] [address] [facility] [address] [facility] [address] [place], [place] [address] Phone: [phone]. Fax:  [phone]

## 2023-12-21 DIAGNOSIS — N1832 Chronic kidney disease, stage 3b: Secondary | ICD-10-CM | POA: Diagnosis not present

## 2023-12-21 DIAGNOSIS — E1140 Type 2 diabetes mellitus with diabetic neuropathy, unspecified: Secondary | ICD-10-CM | POA: Diagnosis not present

## 2023-12-21 DIAGNOSIS — I1310 Hypertensive heart and chronic kidney disease without heart failure, with stage 1 through stage 4 chronic kidney disease, or unspecified chronic kidney disease: Secondary | ICD-10-CM | POA: Diagnosis not present

## 2023-12-21 DIAGNOSIS — M1A0710 Idiopathic chronic gout, right ankle and foot, without tophus (tophi): Secondary | ICD-10-CM | POA: Diagnosis not present

## 2023-12-21 DIAGNOSIS — I480 Paroxysmal atrial fibrillation: Secondary | ICD-10-CM | POA: Diagnosis not present

## 2023-12-21 DIAGNOSIS — E1122 Type 2 diabetes mellitus with diabetic chronic kidney disease: Secondary | ICD-10-CM | POA: Diagnosis not present
